# Patient Record
Sex: Female | Born: 1990 | Hispanic: Yes | Marital: Married | Smoking: Never smoker
Health system: Southern US, Community
[De-identification: ages and names within clinical notes are randomized; demographics above are authoritative.]

## PROBLEM LIST (undated history)

## (undated) ENCOUNTER — Inpatient Hospital Stay: Payer: Self-pay

## (undated) DIAGNOSIS — K8020 Calculus of gallbladder without cholecystitis without obstruction: Secondary | ICD-10-CM

## (undated) HISTORY — PX: NO PAST SURGERIES: SHX2092

## (undated) HISTORY — PX: CHOLECYSTECTOMY: SHX55

---
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
## 2012-02-14 ENCOUNTER — Emergency Department: Payer: Self-pay | Admitting: Emergency Medicine

## 2014-12-16 ENCOUNTER — Observation Stay: Payer: Self-pay | Admitting: Obstetrics and Gynecology

## 2015-03-10 ENCOUNTER — Other Ambulatory Visit: Payer: Self-pay | Admitting: Family Medicine

## 2015-03-10 DIAGNOSIS — Z3483 Encounter for supervision of other normal pregnancy, third trimester: Secondary | ICD-10-CM

## 2015-03-15 ENCOUNTER — Ambulatory Visit: Admission: RE | Admit: 2015-03-15 | Payer: Medicaid Other | Source: Ambulatory Visit

## 2015-03-24 ENCOUNTER — Ambulatory Visit
Admission: RE | Admit: 2015-03-24 | Discharge: 2015-03-24 | Disposition: A | Discharge status: Home / Self Care | Payer: Medicaid Other | Source: Ambulatory Visit | Attending: Family Medicine | Admitting: Family Medicine

## 2015-03-24 DIAGNOSIS — Z36 Encounter for antenatal screening of mother: Secondary | ICD-10-CM | POA: Diagnosis present

## 2015-03-24 DIAGNOSIS — Z3A35 35 weeks gestation of pregnancy: Secondary | ICD-10-CM | POA: Insufficient documentation

## 2015-03-24 DIAGNOSIS — O4103X0 Oligohydramnios, third trimester, not applicable or unspecified: Secondary | ICD-10-CM | POA: Diagnosis not present

## 2015-03-24 DIAGNOSIS — Z3483 Encounter for supervision of other normal pregnancy, third trimester: Secondary | ICD-10-CM

## 2015-06-27 ENCOUNTER — Encounter: Payer: Self-pay | Admitting: Obstetrics and Gynecology

## 2015-07-11 ENCOUNTER — Encounter: Payer: Self-pay | Admitting: Obstetrics and Gynecology

## 2016-01-15 ENCOUNTER — Observation Stay
Admission: EM | Admit: 2016-01-15 | Discharge: 2016-01-15 | Disposition: A | Discharge status: Home / Self Care | Payer: Medicaid Other | Attending: Obstetrics and Gynecology | Admitting: Obstetrics and Gynecology

## 2016-01-15 DIAGNOSIS — Z3A35 35 weeks gestation of pregnancy: Secondary | ICD-10-CM | POA: Insufficient documentation

## 2016-01-15 DIAGNOSIS — O9989 Other specified diseases and conditions complicating pregnancy, childbirth and the puerperium: Principal | ICD-10-CM | POA: Insufficient documentation

## 2016-01-15 DIAGNOSIS — O4290 Premature rupture of membranes, unspecified as to length of time between rupture and onset of labor, unspecified weeks of gestation: Secondary | ICD-10-CM | POA: Diagnosis present

## 2016-01-15 NOTE — OB Triage Note (Signed)
Pt. states she felt a "humongous gush" of fluid at approximately 0435 this morning. Denies VB or contractions. Endorses good fetal movement.

## 2016-01-15 NOTE — Plan of Care (Signed)
Report to MD/informed that pt is 25 yo G5 P2 35 weeks today.  She arrived with complaint of SROM. ES RN did exam and nitrazine negative, did not do exam as pt having no uc's. Reactive tracing at this time.  MD states to have pt discharged home with labor precautions and to keep next scheduled appt. Pt plans [facility] pt. Ellison Carwin Daekwon Beswick RNC

## 2016-01-15 NOTE — Discharge Instructions (Signed)
Keep next scheduled appt with [facility]. Pt to call this morning and get appointment as she states they did not tell her but she thinks it may be tomorrow.  [facility]. Pt agrees with plan of care and is ready to leave with husband ambulatory in stable condition for home. Discussed at length is question leaking again to come for evaluation [facility] or [facility]. Discussed labor precautions.  Ellison Carwin Tashya Alberty RNC

## 2016-01-15 NOTE — Final Progress Note (Signed)
L&D OB Triage Note  Dominique McgregorDora M Shelton is a 25 y.o. [id number] female at 6063w0d, EDD Estimated Date of Delivery: 02/19/16 who presented to triage for complaints of leaking fluid.  She was evaluated by the nurses with no significant findings of ruptured membranes, nitrazine test was negative, and no uterine contractions noted on monitor. Vital signs stable. An NST was performed and has been reviewed by MD.    Physical Exam:  Temperature 97.9 F (36.6 C), temperature source Oral, resp. rate 14, unknown if currently breastfeeding. Cervix: deferred NST INTERPRETATION: Indications: rule out uterine contractions  Mode: External Baseline Rate (A): 135 bpm Variability: Moderate Accelerations: 15 x 15 Decelerations: None     Contraction Frequency (min): none  Impression: reactive   Plan: NST performed was reviewed and was found to be reactive. She was discharged home with bleeding/labor precautions.  Continue routine prenatal care at [facility].  Pl[facility]. Follow up with OB/GYN as previously scheduled.     Hildred LaserAnika Merton Wadlow, MD [facility]

## 2016-05-28 ENCOUNTER — Emergency Department
Admission: EM | Admit: 2016-05-28 | Discharge: 2016-05-28 | Disposition: A | Discharge status: Home / Self Care | Payer: Medicaid Other | Attending: Emergency Medicine | Admitting: Emergency Medicine

## 2016-05-28 ENCOUNTER — Encounter: Payer: Self-pay | Admitting: Emergency Medicine

## 2016-05-28 ENCOUNTER — Emergency Department: Payer: Medicaid Other

## 2016-05-28 DIAGNOSIS — R1011 Right upper quadrant pain: Secondary | ICD-10-CM | POA: Diagnosis present

## 2016-05-28 DIAGNOSIS — K8020 Calculus of gallbladder without cholecystitis without obstruction: Secondary | ICD-10-CM

## 2016-05-28 LAB — CBC
HEMATOCRIT: 37.5 % (ref 35.0–47.0)
Hemoglobin: 13.1 g/dL (ref 12.0–16.0)
MCH: 29.4 pg (ref 26.0–34.0)
MCHC: 35 g/dL (ref 32.0–36.0)
MCV: 83.9 fL (ref 80.0–100.0)
PLATELETS: 222 10*3/uL (ref 150–440)
RBC: 4.47 MIL/uL (ref 3.80–5.20)
RDW: 15.1 % — AB (ref 11.5–14.5)
WBC: 11.1 10*3/uL — AB (ref 3.6–11.0)

## 2016-05-28 LAB — COMPREHENSIVE METABOLIC PANEL
ALK PHOS: 83 U/L (ref 38–126)
ALT: 44 U/L (ref 14–54)
AST: 60 U/L — AB (ref 15–41)
Albumin: 4.2 g/dL (ref 3.5–5.0)
Anion gap: 6 (ref 5–15)
BILIRUBIN TOTAL: 0.6 mg/dL (ref 0.3–1.2)
BUN: 16 mg/dL (ref 6–20)
CALCIUM: 8.8 mg/dL — AB (ref 8.9–10.3)
CO2: 26 mmol/L (ref 22–32)
Chloride: 108 mmol/L (ref 101–111)
Creatinine, Ser: 0.51 mg/dL (ref 0.44–1.00)
GFR calc Af Amer: >60 mL/min (ref >60)
Glucose, Bld: 108 mg/dL — ABNORMAL HIGH (ref 65–99)
POTASSIUM: 3.6 mmol/L (ref 3.5–5.1)
Sodium: 140 mmol/L (ref 135–145)
TOTAL PROTEIN: 7.6 g/dL (ref 6.5–8.1)

## 2016-05-28 LAB — LIPASE, BLOOD: Lipase: 30 U/L (ref 11–51)

## 2016-05-28 MED ORDER — ONDANSETRON HCL 4 MG/2ML IJ SOLN
4.0000 mg | Freq: Once | INTRAMUSCULAR | Status: AC
  Administered 2016-05-28: 4 mg via INTRAVENOUS
  Filled 2016-05-28: qty 2

## 2016-05-28 MED ORDER — OXYCODONE-ACETAMINOPHEN 5-325 MG PO TABS: 1.0000 | ORAL_TABLET | ORAL | 0 refills | Status: DC | PRN

## 2016-05-28 MED ORDER — ONDANSETRON 4 MG PO TBDP: 4.0000 mg | ORAL_TABLET | Freq: Three times a day (TID) | ORAL | 0 refills | Status: DC | PRN

## 2016-05-28 MED ORDER — MORPHINE SULFATE (PF) 2 MG/ML IV SOLN
2.0000 mg | Freq: Once | INTRAVENOUS | Status: AC
Start: 2016-05-28 — End: 2016-05-28
  Administered 2016-05-28: 2 mg via INTRAVENOUS
  Filled 2016-05-28: qty 1

## 2016-05-28 NOTE — Discharge Instructions (Addendum)
1. You may take pain and nausea medicines as needed (Percocet/Zofran #30). 2. Eat a bland diet until seen by the surgeon. Avoid heavy, spicy foods and alcohol. 3. Return to the ER for worsening symptoms, persistent vomiting, difficulty breathing or other concerns.

## 2016-05-28 NOTE — ED Notes (Signed)
Pt taken to US

## 2016-05-28 NOTE — ED Triage Notes (Signed)
Pt presents to with c/o abdominal pain x2 months and started having nausea/vomiting x2 weeks. Pt states she noticed blood in emesis once about a week ago. Pt denies urinary symptoms.

## 2016-05-28 NOTE — ED Provider Notes (Signed)
[facility] Emergency Department Provider Note   ____________________________________________   First MD Initiated Contact with Patient 05/28/16 [phone]     (approximate)  I have reviewed the triage vital signs and the nursing notes.   HISTORY  Chief Complaint Abdominal Pain    HPI Dominique Shelton is a 25 y.o. female who presents to the ED from home with a chief complain of abdominal pain. Patient is 3 months postpartum, not breast-feeding, who has experienced waxing/waning epigastric and right upper quadrant abdominal pain for the past 2 months. For the last 2 weeks she has been experiencing intermittent nausea and vomiting. This morning she presents for more severe pain after eating pork chops for dinner last night. Describes sharp, stabbing pain which is occasionally a burning sensation. Symptoms tonight were not associated with fever, chills, chest pain, shortness of breath, nausea, vomiting, diarrhea. Denies recent travel or trauma. Nothing makes her symptoms better. Food sometimes makes her symptoms worse.   Past medical history None  Patient Active Problem List   Diagnosis Date Noted  . Amniotic fluid leaking 01/15/2016    History reviewed. No pertinent surgical history.  Prior to Admission medications   Medication Sig Start Date End Date Taking? Authorizing Provider  Prenatal Vit-Fe Fumarate-FA (PRENATAL MULTIVITAMIN) TABS tablet Take 1 tablet by mouth daily at 12 noon.    Historical Provider, MD    Allergies Review of patient's allergies indicates no known allergies.  History reviewed. No pertinent family history.  Social History Social History  Substance Use Topics  . Smoking status: Never Smoker  . Smokeless tobacco: Never Used  . Alcohol use No    Review of Systems  Constitutional: No fever/chills. Eyes: No visual changes. ENT: No sore throat. Cardiovascular: Denies chest pain. Respiratory: Denies shortness of  breath. Gastrointestinal: Positive for abdominal pain.  No nausea, no vomiting.  No diarrhea.  No constipation. Genitourinary: Negative for dysuria. Musculoskeletal: Negative for back pain. Skin: Negative for rash. Neurological: Negative for headaches, focal weakness or numbness.  10-point ROS otherwise negative.  ____________________________________________   PHYSICAL EXAM:  VITAL SIGNS: ED Triage Vitals [05/28/16 0509]  Enc Vitals Group     BP 129/89     Pulse Rate 63     Resp 18     Temp 97.9 F (36.6 C)     Temp Source Oral     SpO2 98 %     Weight      Height  (1.549 m)     Head Circumference      Peak Flow      Pain Score 9     Pain Loc      Pain Edu?      Excl. in GC?     Constitutional: Alert and oriented. Well appearing and in no acute distress. Eyes: Conjunctivae are normal. PERRL. EOMI. Head: Atraumatic. Nose: No congestion/rhinnorhea. Mouth/Throat: Mucous membranes are moist.  Oropharynx non-erythematous. Neck: No stridor.   Cardiovascular: Normal rate, regular rhythm. Grossly normal heart sounds.  Good peripheral circulation. Respiratory: Normal respiratory effort.  No retractions. Lungs CTAB. Gastrointestinal: Soft and mildly tender to palpation epigastrium and right upper quadrant without rebound or guarding. No distention. No abdominal bruits. No CVA tenderness. Musculoskeletal: No lower extremity tenderness nor edema.  No joint effusions. Neurologic:  Normal speech and language. No gross focal neurologic deficits are appreciated. No gait instability. Skin:  Skin is warm, dry and intact. No rash noted. Psychiatric: Mood and affect are normal. Speech and  behavior are normal.  ____________________________________________   LABS (all labs ordered are listed, but only abnormal results are displayed)  Labs Reviewed  COMPREHENSIVE METABOLIC PANEL - Abnormal; Notable for the following:       Result Value   Glucose, Bld 108 (*)    Calcium 8.8 (*)     AST 60 (*)    All other components within normal limits  CBC - Abnormal; Notable for the following:    WBC 11.1 (*)    RDW 15.1 (*)    All other components within normal limits  LIPASE, BLOOD  URINALYSIS COMPLETEWITH MICROSCOPIC ([facility] ONLY)   ____________________________________________  EKG  None ____________________________________________  RADIOLOGY  Limited right upper quadrant abdominal ultrasound interpreted per Dr. Tyron RussellBoles: Cholelithiasis without evidence acute cholecystitis. ____________________________________________   PROCEDURES  Procedure(s) performed: None  Procedures  Critical Care performed: No  ____________________________________________   INITIAL IMPRESSION / ASSESSMENT AND PLAN / ED COURSE  Pertinent labs & imaging results that were available during my care of the patient were reviewed by me and considered in my medical decision making (see chart for details).  25 year old female who presents with epigastric and right upper quadrant abdominal pain. Initial laboratory results including LFTs and lipase are unremarkable. Will obtain right upper quadrant ultrasound to evaluate for cholecystitis.  Clinical Course  Comment By Time  Patient improved. Updated her of ultrasound results. Will refer to general surgery for outpatient follow-up. Strict return precautions given. Patient verbalizes understanding and agrees with plan of care. Irean HongJade J Sohrab Keelan, MD 08/08 [id number]     ____________________________________________   FINAL CLINICAL IMPRESSION(S) / ED DIAGNOSES  Final diagnoses:  Epigastric pain  Right upper quadrant pain  Calculus of gallbladder without cholecystitis without obstruction      NEW MEDICATIONS STARTED DURING THIS VISIT:  New Prescriptions   No medications on file     Note:  This document was prepared using Dragon voice recognition software and may include unintentional dictation errors.    Irean HongJade J Lean Fayson, MD 05/28/16 0730

## 2016-06-06 ENCOUNTER — Ambulatory Visit (INDEPENDENT_AMBULATORY_CARE_PROVIDER_SITE_OTHER): Payer: Medicaid Other | Admitting: Surgery

## 2016-06-06 ENCOUNTER — Encounter: Payer: Self-pay | Admitting: Surgery

## 2016-06-06 VITALS — BP 119/80 | HR 66 | Temp 98.5°F | Ht 61.0 in | Wt 176.0 lb

## 2016-06-06 DIAGNOSIS — K8050 Calculus of bile duct without cholangitis or cholecystitis without obstruction: Secondary | ICD-10-CM

## 2016-06-06 DIAGNOSIS — K279 Peptic ulcer, site unspecified, unspecified as acute or chronic, without hemorrhage or perforation: Secondary | ICD-10-CM

## 2016-06-06 DIAGNOSIS — K921 Melena: Secondary | ICD-10-CM

## 2016-06-06 MED ORDER — FAMOTIDINE 20 MG PO TABS: 20.0000 mg | ORAL_TABLET | Freq: Every day | ORAL | 1 refills | Status: DC

## 2016-06-06 MED ORDER — SUCRALFATE 1 G PO TABS: 1.0000 g | ORAL_TABLET | Freq: Three times a day (TID) | ORAL | 1 refills | Status: DC

## 2016-06-06 NOTE — Patient Instructions (Signed)
We will see you next week to follow up on how are feeling with the prescribed medications.   We will also schedule you an appointment to see Dr. Midge Miniumarren Wohl. He is a SolicitorGastroenterologist. We will call you with that appointment as soon as it's scheduled.

## 2016-06-06 NOTE — Progress Notes (Signed)
Dominique McgregorDora M Shelton is an [age] y.o. female.   Chief Complaint: Epigastric pain HPI: This a patient who is in the emergency room for epigastric and right upper quadrant pain her workup suggested gallstones without cholecystitis. I was asked see the patient for gallstones and probable biliary colic. On further questioning of the patient she describes 2 months of epigastric pain right upper quadrant pain left upper quadrant pain and back pain is not associated with fatty foods and sometimes happens on an empty stomach. She's had no fevers she's had nausea and vomiting and his attacks are fairly frequent. Of great significance is that she has vomited blood on 3 different occasions and has had multiple stools which are melanotic. she is not dizzy. She has not taken any medications to improve or attempt to change the course of this other than what sounds like an anti-E Medick and oxycodone  No past medical history on file.  Past Surgical History:  Procedure Laterality Date  . NO PAST SURGERIES      Family History  Problem Relation Age of Onset  . Heart disease Mother   . Hypertension Mother   . Heart disease Father   . Hypertension Father    Social History:  reports that she has never smoked. She has never used smokeless tobacco. She reports that she drinks alcohol. She reports that she does not use drugs.  Allergies: No Known Allergies   (Not in a hospital admission)   Review of Systems:   Review of Systems  Constitutional: Negative for chills and fever.  HENT: Negative.   Eyes: Negative.   Respiratory: Negative.   Cardiovascular: Negative.   Gastrointestinal: Positive for abdominal pain, blood in stool, heartburn, melena, nausea and vomiting. Negative for constipation and diarrhea.  Genitourinary: Negative.   Musculoskeletal: Negative.   Skin: Negative.   Neurological: Positive for weakness.  Endo/Heme/Allergies: Negative.   Psychiatric/Behavioral: Negative.     Physical  Exam:  Physical Exam  Constitutional: She is oriented to person, place, and time and well-developed, well-nourished, and in no distress. No distress.  HENT:  Head: Normocephalic and atraumatic.  Eyes: Right eye exhibits no discharge. Left eye exhibits no discharge. No scleral icterus.  Neck: Normal range of motion.  Cardiovascular: Normal rate, regular rhythm and normal heart sounds.   No murmur heard. Pulmonary/Chest: Effort normal and breath sounds normal. No respiratory distress. She has no wheezes. She has no rales.  Abdominal: Soft. She exhibits no distension. There is no tenderness. There is no rebound and no guarding.  Negative Murphy sign Minimal epigastric tenderness, no right upper quadrant tenderness no CVA tenderness  Musculoskeletal: Normal range of motion. She exhibits no edema or tenderness.  Lymphadenopathy:    She has no cervical adenopathy.  Neurological: She is alert and oriented to person, place, and time.  Skin: Skin is warm and dry. No rash noted. She is not diaphoretic. No erythema.  Multiple tattoos  Psychiatric: Mood and affect normal.  Vitals reviewed.   Blood pressure 119/80, pulse 66, temperature 98.5 F (36.9 C), temperature source Oral, height 5\' 1"  (1.549 m), weight 176 lb (79.8 kg), currently breastfeeding.    No results found for this or any previous visit (from the past 48 hour(s)). No results found.   Assessment/Plan Labs and studies are reviewed personally. Suggestive of possible biliary colic. However her symptoms are suggestive more of peptic ulcer disease in that she has not tried any antacids to relieve her symptoms but she's had mostly epigastric pain  with some hematemesis and melena. With that in mind I would like to try Carafate and a proton pump inhibitor or H2 blocker and see her back prior to scheduling gallbladder surgery.  Lattie Hawichard E Eulia Hatcher, MD, FACS

## 2016-06-13 ENCOUNTER — Ambulatory Visit: Payer: Self-pay | Admitting: General Surgery

## 2016-06-17 ENCOUNTER — Ambulatory Visit: Payer: Self-pay | Admitting: Surgery

## 2016-06-30 ENCOUNTER — Emergency Department
Admission: EM | Admit: 2016-06-30 | Discharge: 2016-06-30 | Discharge status: Against Medical Advice | Payer: Medicaid Other | Attending: Emergency Medicine | Admitting: Emergency Medicine

## 2016-06-30 ENCOUNTER — Encounter: Payer: Self-pay | Admitting: Emergency Medicine

## 2016-06-30 DIAGNOSIS — R1013 Epigastric pain: Secondary | ICD-10-CM | POA: Diagnosis present

## 2016-06-30 HISTORY — DX: Calculus of gallbladder without cholecystitis without obstruction: K80.20

## 2016-06-30 MED ORDER — SODIUM CHLORIDE 0.9 % IV BOLUS (SEPSIS): 1000.0000 mL | Freq: Once | INTRAVENOUS | Status: DC

## 2016-06-30 MED ORDER — MORPHINE SULFATE (PF) 4 MG/ML IV SOLN: 4.0000 mg | Freq: Once | INTRAVENOUS | Status: DC

## 2016-06-30 MED ORDER — ONDANSETRON HCL 4 MG/2ML IJ SOLN: 4.0000 mg | Freq: Once | INTRAMUSCULAR | Status: DC

## 2016-06-30 NOTE — ED Notes (Signed)
Spoke with MD - Dr. Manson PasseyBrown does NOT wish for protocol orders to be carried at this time; no imaging or labs. Will pass along to on-coming RN.

## 2016-06-30 NOTE — ED Triage Notes (Addendum)
Pt seen here about 4 weeks ago and diagnosed with gallstones; followed up with surgeon and placed on medication; pt was told if pain did not improve she would need to be scheduled for cholecystectomy; pt says she began having RUQ pain last evening; was able to rest some; woke around 0130 with pain that has been increasing since; N/, no vomiting; pt says she still has a few percocet and zofran left that she was prescribed but did not take any because "the pain gets worse in the afternoon" and she wanted to save them for the afternoon

## 2016-06-30 NOTE — ED Notes (Signed)
Notified by EDT that patient wanting to leave due to having somewhere to be.  Informed tech that I was tied up with another patient and asked him to notify MD.  MD was notified by EDT who also called me to inform me that the patient wanted to leave.  [facility].

## 2016-06-30 NOTE — ED Notes (Signed)
Patient requesting to leave A[facility] to [facility]. Patient understands risk of leaving AMA.

## 2016-06-30 NOTE — ED Provider Notes (Signed)
[facility] Emergency Department Provider Note    ____________________________________________   I have reviewed the triage vital signs and the nursing notes.   HISTORY  Chief Complaint Abdominal Pain   History limited by: Not Limited   HPI Dominique Shelton is a 25 y.o. female with history of gallstones who presents to the emergency department today because of concerns for epigastric pain. Patient has been having the pain on and off for weeks. Was seen previously in the emergency department with right upper quadrant ultrasound showing gallstones without any signs of cholecystitis. She did have one follow-up appointment with surgery. She was given pain medications. Starting last night pain became more severe. It was located in the epigastric region. The patient did not try any of her pain medication. She did have some accompanying vomiting. Denied any blood in the vomit. She denies any recent fevers.   Past Medical History:  Diagnosis Date  . Gallstones     Patient Active Problem List   Diagnosis Date Noted  . 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 NO PAST SURGERIES      Prior to Admission medications   Medication Sig Start Date End Date Taking? Authorizing Provider  famotidine (PEPCID) 20 MG tablet Take 1 tablet (20 mg total) by mouth daily. 06/06/16  Yes Lattie Haw, MD  ondansetron (ZOFRAN ODT) 4 MG disintegrating tablet Take 1 tablet (4 mg total) by mouth every 8 (eight) hours as needed for nausea or vomiting. 05/28/16  Yes Irean Hong, MD  oxyCODONE-acetaminophen (ROXICET) 5-325 MG tablet Take 1 tablet by mouth every 4 (four) hours as needed for severe pain. 05/28/16  Yes Irean Hong, MD  Prenatal Vit-Fe Fumarate-FA (PRENATAL MULTIVITAMIN) TABS tablet Take 1 tablet by mouth daily at 12 noon.   Yes Historical Provider, MD  sucralfate (CARAFATE) 1 g tablet Take 1 tablet (1 g total) by mouth 4 (four)  times daily -  with meals and at bedtime. 06/06/16  Yes Lattie Haw, MD    Allergies Review of patient's allergies indicates no known allergies.  Family History  Problem Relation Age of Onset  . Heart disease Mother   . Hypertension Mother   . Heart disease Father   . Hypertension Father     Social History Social History  Substance Use Topics  . Smoking status: Never Smoker  . Smokeless tobacco: Never Used  . Alcohol use Yes     Comment: occas    Review of Systems  Constitutional: Negative for fever. Cardiovascular: Negative for chest pain. Respiratory: Negative for shortness of breath. Gastrointestinal: Positive for abdominal pain and vomiting. Genitourinary: Negative for dysuria. Musculoskeletal: Negative for back pain. Skin: Negative for rash. Neurological: Negative for headaches, focal weakness or numbness.   10-point ROS otherwise negative.  ____________________________________________   PHYSICAL EXAM:  VITAL SIGNS: ED Triage Vitals   Enc Vitals Group     BP 134/82     Pulse Rate 60     Resp 18     Temp 97.6 F (36.4 C)     Temp Source Oral     SpO2 99 %     Weight 176 lb (79.8 kg)     Height 5\' 1"  (1.549 m)     Head Circumference      Peak Flow      Pain Score 9   Constitutional: Alert and oriented. Appears uncomfortable.  Eyes: Conjunctivae are normal. Normal  extraocular movements. ENT   Head: Normocephalic and atraumatic.   Nose: No congestion/rhinnorhea.   Mouth/Throat: Mucous membranes are moist.   Neck: No stridor. Hematological/Lymphatic/Immunilogical: No cervical lymphadenopathy. Cardiovascular: Normal rate, regular rhythm.  No murmurs, rubs, or gallops. Respiratory: Normal respiratory effort without tachypnea nor retractions. Breath sounds are clear and equal bilaterally. No wheezes/rales/rhonchi. Gastrointestinal: Soft and tender to palpation in the epigastric and right upper quadrants. No rebound. No guarding.   Genitourinary: Deferred Musculoskeletal: Normal range of motion in all extremities. No lower extremity edema. Neurologic:  Normal speech and language. No gross focal neurologic deficits are appreciated.  Skin:  Skin is warm, dry and intact. No rash noted. Psychiatric: Mood and affect are normal. Speech and behavior are normal. Patient exhibits appropriate insight and judgment.  ____________________________________________    LABS (pertinent positives/negatives)  Patient left prior to lab work  ____________________________________________   EKG  None  ____________________________________________    RADIOLOGY  None  ____________________________________________   PROCEDURES  Procedures  ____________________________________________   INITIAL IMPRESSION / ASSESSMENT AND PLAN / ED COURSE  Pertinent labs & imaging results that were available during my care of the patient were reviewed by me and considered in my medical decision making (see chart for details).  Patient presented to the emergency department today because of concern for abdominal pain, worse since last night. History of gallstones, has seen surgery as outpatient. Did not take any home pain medication. Has had vomiting. Concern for cholecystitis/biliary colic/gastritis/pancreatitis. Will check blood work, give pain medication and fluids. At this point will hold on US of RUQ unless blood work with leukocytosis, elevated liver tests or lipase.  Clinical Course   Called to patient's room. They stated they would like to be discharged. When asked why they stated they wanted to [facility] because they did not think blood work and pain medication and then discharge would be sufficient. I explained to the patient and companion that the plan would be to check blood work, give medication and then reassess to see if further testing/work up would be necessary. They still requested to be  discharged. ____________________________________________   FINAL CLINICAL IMPRESSION(S) / ED DIAGNOSES  Final diagnoses:  Epigastric pain     Note: This dictation was prepared with Dragon dictation. Any transcriptional errors that result from this process are unintentional    Phineas SemenGraydon Sony Schlarb, MD 06/30/16 [phone]

## 2016-07-01 ENCOUNTER — Ambulatory Visit: Payer: Self-pay | Admitting: Surgery

## 2016-07-11 ENCOUNTER — Ambulatory Visit (INDEPENDENT_AMBULATORY_CARE_PROVIDER_SITE_OTHER): Payer: Self-pay | Admitting: Gastroenterology

## 2016-07-11 VITALS — BP 125/68 | HR 76 | Temp 98.4°F | Ht 61.0 in | Wt 177.0 lb

## 2016-07-11 DIAGNOSIS — R110 Nausea: Secondary | ICD-10-CM

## 2016-07-11 DIAGNOSIS — K920 Hematemesis: Secondary | ICD-10-CM

## 2016-07-11 DIAGNOSIS — K921 Melena: Secondary | ICD-10-CM

## 2016-07-11 NOTE — Progress Notes (Signed)
Gastroenterology Consultation  Referring Provider:     Lattie Hawooper, Richard E, MD Primary Care Physician:  No PCP Per Patient Primary Gastroenterologist:  Dr. Servando SnareWohl     Reason for Consultation:     Hematemesis and rectal bleeding        HPI:   Dominique Shelton is a 25 y.o. y/o female referred for consultation & management of Hematemesis or rectal bleeding by Dr. Bonnetta BarryNo PCP Per Patient.  Patient was sent to me for evaluation after having some nausea with vomiting and bloody stools.   Patient states the vomiting blood occurred after she would vomit multiple times and then blood would come up.  The patient states that since she had her gallbladder taken out she has been feeling much better and only has pain in her abdomen with scars are from her surgery.   She states that over the last 3 weeks she has had no further igns of bleeding or vomiting. The patient reports that her symptoms started shortly after she had given birth to her son 5 months ago.  She states that it started approximately 2 months after that  Past Medical History:  Diagnosis Date  . Gallstones     Past Surgical History:  Procedure Laterality Date  . NO PAST SURGERIES      Prior to Admission medications   Medication Sig Start Date End Date Taking? Authorizing Provider  famotidine (PEPCID) 20 MG tablet Take 1 tablet (20 mg total) by mouth daily. 06/06/16  Yes Lattie Hawichard E Cooper, MD  ondansetron (ZOFRAN ODT) 4 MG disintegrating tablet Take 1 tablet (4 mg total) by mouth every 8 (eight) hours as needed for nausea or vomiting. 05/28/16  Yes Irean HongJade J Sung, MD  Prenatal Vit-Fe Fumarate-FA (PRENATAL MULTIVITAMIN) TABS tablet Take 1 tablet by mouth daily at 12 noon.   Yes Historical Provider, MD  sucralfate (CARAFATE) 1 g tablet Take 1 tablet (1 g total) by mouth 4 (four) times daily -  with meals and at bedtime. 06/06/16  Yes Lattie Hawichard E Cooper, MD  oxyCODONE-acetaminophen (ROXICET) 5-325 MG tablet Take 1 tablet by mouth every 4 (four) hours as needed  for severe pain. Patient not taking: Reported on 07/11/2016 05/28/16   Irean HongJade J Sung, MD    Family History  Problem Relation Age of Onset  . Heart disease Mother   . Hypertension Mother   . Heart disease Father   . Hypertension Father      Social History  Substance Use Topics  . Smoking status: Never Smoker  . Smokeless tobacco: Never Used  . Alcohol use Yes     Comment: occas    Allergies as of 07/11/2016  . (No Known Allergies)    Review of Systems:    All systems reviewed and negative except where noted in HPI.   Physical Exam:  BP 125/68   Pulse 76   Temp 98.4 F (36.9 C) (Oral)   Ht 5\' 1"  (1.549 m)   Wt 177 lb (80.3 kg)   LMP  (LMP Unknown)   BMI 33.44 kg/m  No LMP recorded (lmp unknown). Patient has had an implant. Psych:  Alert and cooperative. Normal mood and affect. General:   Alert,  Well-developed, well-nourished, pleasant and cooperative in NAD Head:  Normocephalic and atraumatic. Eyes:  Sclera clear, no icterus.   Conjunctiva pink. Ears:  Normal auditory acuity. Nose:  No deformity, discharge, or lesions. Mouth:  No deformity or lesions,oropharynx pink & moist. Neck:  Supple; no masses  or thyromegaly. Lungs:  Respirations even and unlabored.  Clear throughout to auscultation.   No wheezes, crackles, or rhonchi. No acute distress. Heart:  Regular rate and rhythm; no murmurs, clicks, rubs, or gallops. Abdomen:  Normal bowel sounds.  No bruits.  Soft, non-tender and non-distended without masses, hepatosplenomegaly or hernias noted.  No guarding or rebound tenderness.  Negative Carnett sign.   Rectal:  Deferred.  Msk:  Symmetrical without gross deformities.  Good, equal movement & strength bilaterally. Pulses:  Normal pulses noted. Extremities:  No clubbing or edema.  No cyanosis. Neurologic:  Alert and oriented x3;  grossly normal neurologically. Skin:  Intact without significant lesions or rashes.  No jaundice. Lymph Nodes:  No significant cervical  adenopathy. Psych:  Alert and cooperative. Normal mood and affect.  Imaging Studies: No results found.  Assessment and Plan:   Dominique Shelton is a 25 y.o. y/o female Who reports that she had nausea vomiting and rectal bleeding with diarrhea prior to having her gallbladder out.  The patient states that she has been doing well since without any further problems.  The patient also reports that her abdominal pain has resolved.  The patient has been told to contact me if any of her symptoms return.  As for now the patient will be followed as needed.   Note: This dictation was prepared with Dragon dictation along with smaller phrase technology. Any transcriptional errors that result from this process are unintentional.

## 2016-09-13 ENCOUNTER — Encounter: Payer: Self-pay | Admitting: Emergency Medicine

## 2016-09-13 ENCOUNTER — Emergency Department
Admission: EM | Admit: 2016-09-13 | Discharge: 2016-09-14 | Disposition: A | Discharge status: Home / Self Care | Payer: Medicaid Other | Attending: Emergency Medicine | Admitting: Emergency Medicine

## 2016-09-13 DIAGNOSIS — R109 Unspecified abdominal pain: Secondary | ICD-10-CM | POA: Diagnosis present

## 2016-09-13 DIAGNOSIS — Z79899 Other long term (current) drug therapy: Secondary | ICD-10-CM | POA: Diagnosis not present

## 2016-09-13 DIAGNOSIS — R1084 Generalized abdominal pain: Secondary | ICD-10-CM

## 2016-09-13 DIAGNOSIS — N390 Urinary tract infection, site not specified: Secondary | ICD-10-CM | POA: Diagnosis not present

## 2016-09-13 DIAGNOSIS — R112 Nausea with vomiting, unspecified: Secondary | ICD-10-CM

## 2016-09-13 NOTE — ED Triage Notes (Signed)
Per EMS patient called out for severe n/v/d with projectile vomit the last couple of hours.  Pt states it was brown in color.  She appears out of it but answers questions if asked.

## 2016-09-14 ENCOUNTER — Emergency Department: Payer: Medicaid Other

## 2016-09-14 LAB — URINALYSIS COMPLETE WITH MICROSCOPIC (ARMC ONLY)
Bilirubin Urine: NEGATIVE
GLUCOSE, UA: NEGATIVE mg/dL
NITRITE: NEGATIVE
Protein, ur: 100 mg/dL — AB
Specific Gravity, Urine: 1.034 — ABNORMAL HIGH (ref 1.005–1.030)
pH: 5 (ref 5.0–8.0)

## 2016-09-14 LAB — CBC
HEMATOCRIT: 46.4 % (ref 35.0–47.0)
Hemoglobin: 15.5 g/dL (ref 12.0–16.0)
MCH: 28.9 pg (ref 26.0–34.0)
MCHC: 33.4 g/dL (ref 32.0–36.0)
MCV: 86.4 fL (ref 80.0–100.0)
Platelets: 299 10*3/uL (ref 150–440)
RBC: 5.37 MIL/uL — ABNORMAL HIGH (ref 3.80–5.20)
RDW: 14.5 % (ref 11.5–14.5)
WBC: 23.4 10*3/uL — ABNORMAL HIGH (ref 3.6–11.0)

## 2016-09-14 LAB — COMPREHENSIVE METABOLIC PANEL
ALBUMIN: 5.1 g/dL — AB (ref 3.5–5.0)
ALT: 30 U/L (ref 14–54)
AST: 29 U/L (ref 15–41)
Alkaline Phosphatase: 90 U/L (ref 38–126)
Anion gap: 10 (ref 5–15)
BILIRUBIN TOTAL: 0.4 mg/dL (ref 0.3–1.2)
BUN: 20 mg/dL (ref 6–20)
CO2: 21 mmol/L — ABNORMAL LOW (ref 22–32)
Calcium: 9.5 mg/dL (ref 8.9–10.3)
Chloride: 108 mmol/L (ref 101–111)
Creatinine, Ser: 0.76 mg/dL (ref 0.44–1.00)
GFR calc Af Amer: >60 mL/min (ref >60)
GFR calc non Af Amer: >60 mL/min (ref >60)
GLUCOSE: 130 mg/dL — AB (ref 65–99)
POTASSIUM: 3.7 mmol/L (ref 3.5–5.1)
Sodium: 139 mmol/L (ref 135–145)
TOTAL PROTEIN: 9.4 g/dL — AB (ref 6.5–8.1)

## 2016-09-14 LAB — LIPASE, BLOOD: Lipase: 20 U/L (ref 11–51)

## 2016-09-14 LAB — HCG, QUANTITATIVE, PREGNANCY

## 2016-09-14 MED ORDER — DEXTROSE 5 % IV SOLN: 1.0000 g | Freq: Once | INTRAVENOUS | Status: DC

## 2016-09-14 MED ORDER — IOPAMIDOL (ISOVUE-300) INJECTION 61%
100.0000 mL | Freq: Once | INTRAVENOUS | Status: AC | PRN
  Administered 2016-09-14: 100 mL via INTRAVENOUS

## 2016-09-14 MED ORDER — ONDANSETRON HCL 4 MG/2ML IJ SOLN
4.0000 mg | Freq: Once | INTRAMUSCULAR | Status: AC
  Administered 2016-09-14: 4 mg via INTRAVENOUS
  Filled 2016-09-14: qty 2

## 2016-09-14 MED ORDER — CEPHALEXIN 500 MG PO CAPS: 500.0000 mg | ORAL_CAPSULE | Freq: Three times a day (TID) | ORAL | 0 refills | Status: DC

## 2016-09-14 MED ORDER — CEFTRIAXONE SODIUM-DEXTROSE 1-3.74 GM-% IV SOLR
1.0000 g | Freq: Once | INTRAVENOUS | Status: AC
  Administered 2016-09-14: 1 g via INTRAVENOUS
  Filled 2016-09-14: qty 50

## 2016-09-14 MED ORDER — MORPHINE SULFATE (PF) 4 MG/ML IV SOLN
4.0000 mg | Freq: Once | INTRAVENOUS | Status: AC
  Administered 2016-09-14: 4 mg via INTRAVENOUS
  Filled 2016-09-14: qty 1

## 2016-09-14 MED ORDER — ONDANSETRON 4 MG PO TBDP: 4.0000 mg | ORAL_TABLET | Freq: Three times a day (TID) | ORAL | 0 refills | Status: DC | PRN

## 2016-09-14 MED ORDER — IOPAMIDOL (ISOVUE-300) INJECTION 61%
30.0000 mL | Freq: Once | INTRAVENOUS | Status: AC | PRN
  Administered 2016-09-14: 30 mL via ORAL

## 2016-09-14 NOTE — ED Provider Notes (Signed)
[facility] Emergency Department Provider Note   ____________________________________________   First MD Initiated Contact with Patient 09/14/16 [phone]     (approximate)  I have reviewed the triage vital signs and the nursing notes.   HISTORY  Chief Complaint Abdominal Pain    HPI Dominique Shelton is a 25 y.o. female who presents to the ED from home via EMS with a chief complaint of abdominal pain.Patient has a history of recurrent abdominal pain, nausea/vomiting and rectal bleeding with diarrhea following birth of child in the spring. She had a laparoscopic cholecystectomy in September which greatly alleviated most of her symptoms. She has been followed as needed by GI for hematemesis and rectal bleeding. Complains of generalized abdominal pain, nausea, vomiting and diarrhea since approximately 2 PM. Reports equal number of vomiting and diarrhea. Denies associated fever, chills, chest pain, shortness of breath, dysuria. Denies recent travel or trauma. Nothing makes her symptoms better or worse.   Past Medical History:  Diagnosis Date  . Gallstones     Patient Active Problem List   Diagnosis Date Noted  . Amniotic fluid leaking 01/15/2016    Past surgical history Laparoscopic cholecystectomy  Prior to Admission medications   Medication Sig Start Date End Date Taking? Authorizing Provider  cephALEXin (KEFLEX) 500 MG capsule Take 1 capsule (500 mg total) by mouth 3 (three) times daily. 09/14/16   Irean Hong, MD  famotidine (PEPCID) 20 MG tablet Take 1 tablet (20 mg total) by mouth daily. 06/06/16   Lattie Haw, MD  ondansetron (ZOFRAN ODT) 4 MG disintegrating tablet Take 1 tablet (4 mg total) by mouth every 8 (eight) hours as needed for nausea or vomiting. 09/14/16   Irean Hong, MD  oxyCODONE-acetaminophen (ROXICET) 5-325 MG tablet Take 1 tablet by mouth every 4 (four) hours as needed for severe pain. Patient not taking: Reported on 07/11/2016 05/28/16    Irean Hong, MD  Prenatal Vit-Fe Fumarate-FA (PRENATAL MULTIVITAMIN) TABS tablet Take 1 tablet by mouth daily at 12 noon.    Historical Provider, MD  sucralfate (CARAFATE) 1 g tablet Take 1 tablet (1 g total) by mouth 4 (four) times daily -  with meals and at bedtime. 06/06/16   Lattie Haw, MD    Allergies Patient has no known allergies.  Family History  Problem Relation Age of Onset  . Heart disease Mother   . Hypertension Mother   . Heart disease Father   . Hypertension Father     Social History Social History  Substance Use Topics  . Smoking status: Never Smoker  . Smokeless tobacco: Never Used  . Alcohol use Yes     Comment: occas    Review of Systems  Constitutional: No fever/chills. Eyes: No visual changes. ENT: No sore throat. Cardiovascular: Denies chest pain. Respiratory: Denies shortness of breath. Gastrointestinal: Positive for abdominal pain, nausea, vomiting and diarrhea.  No constipation. Genitourinary: Negative for dysuria. Musculoskeletal: Negative for back pain. Skin: Negative for rash. Neurological: Negative for headaches, focal weakness or numbness.  10-point ROS otherwise negative.  ____________________________________________   PHYSICAL EXAM:  VITAL SIGNS: ED Triage Vitals [09/13/16 2351]  Enc Vitals Group     BP 95/68     Pulse Rate (!) 105     Resp 15     Temp      Temp src      SpO2 96 %     Weight      Height  Head Circumference      Peak Flow      Pain Score      Pain Loc      Pain Edu?      Excl. in GC?     Constitutional: Alert and oriented. Well appearing and in mild acute distress. Eyes: Conjunctivae are normal. PERRL. EOMI. Head: Atraumatic. Nose: No congestion/rhinnorhea. Mouth/Throat: Mucous membranes are moist.  Oropharynx non-erythematous. Neck: No stridor.   Cardiovascular: Normal rate, regular rhythm. Grossly normal heart sounds.  Good peripheral circulation. Respiratory: Normal respiratory effort.   No retractions. Lungs CTAB. Gastrointestinal: Soft and mild diffuse tenderness to palpation without rebound or guarding. No distention. No abdominal bruits. No CVA tenderness. Musculoskeletal: No lower extremity tenderness nor edema.  No joint effusions. Neurologic:  Normal speech and language. No gross focal neurologic deficits are appreciated. No gait instability. Skin:  Skin is warm, dry and intact. No rash noted. Psychiatric: Mood and affect are normal. Speech and behavior are normal.  ____________________________________________   LABS (all labs ordered are listed, but only abnormal results are displayed)  Labs Reviewed  COMPREHENSIVE METABOLIC PANEL - Abnormal; Notable for the following:       Result Value   CO2 21 (*)    Glucose, Bld 130 (*)    Total Protein 9.4 (*)    Albumin 5.1 (*)    All other components within normal limits  CBC - Abnormal; Notable for the following:    WBC 23.4 (*)    RBC 5.37 (*)    All other components within normal limits  URINALYSIS COMPLETEWITH MICROSCOPIC ([facility] ONLY) - Abnormal; Notable for the following:    Color, Urine AMBER (*)    APPearance CLOUDY (*)    Ketones, ur TRACE (*)    Specific Gravity, Urine 1.034 (*)    Hgb urine dipstick 3+ (*)    Protein, ur 100 (*)    Leukocytes, UA 1+ (*)    Bacteria, UA FEW (*)    Squamous Epithelial / LPF 6-30 (*)    All other components within normal limits  LIPASE, BLOOD  HCG, QUANTITATIVE, PREGNANCY   ____________________________________________  EKG  None ____________________________________________  RADIOLOGY  CT abdomen and pelvis with contrast interpreted per Dr. Harrie JeansStratton: No acute process identified. Status post cholecystectomy. ____________________________________________   PROCEDURES  Procedure(s) performed: None  Procedures  Critical Care performed: No  ____________________________________________   INITIAL IMPRESSION / ASSESSMENT AND PLAN / ED COURSE  Pertinent  labs & imaging results that were available during my care of the patient were reviewed by me and considered in my medical decision making (see chart for details).  25 year old female who presents with generalized abdominal pain, N/V/D. Laboratory results reveal leukocytosis; electrolytes within normal limits. Will continue IV fluid resuscitation, analgesia, antiemetic and proceed to CT scanning of abdomen/pelvis to evaluate for intra-abdominal etiology.  Clinical Course as of Sep 15 615  Sat Sep 14, 2016  [id number] Delay secondary to another critical patient. Patient is feeling much better. Tolerated PO without emesis. Will place on Keflex for UTI, Zofran as needed for nauseous as vomiting and patient will follow-up with her PCP next week. Strict return precautions given. Patient verbalizes understanding and agrees with plan of care.  [JS]    Clinical Course User Index [JS] Irean HongJade J Sung, MD     ____________________________________________   FINAL CLINICAL IMPRESSION(S) / ED DIAGNOSES  Final diagnoses:  Lower urinary tract infectious disease  Generalized abdominal pain  Non-intractable vomiting with nausea, unspecified vomiting type  NEW MEDICATIONS STARTED DURING THIS VISIT:  New Prescriptions   CEPHALEXIN (KEFLEX) 500 MG CAPSULE    Take 1 capsule (500 mg total) by mouth 3 (three) times daily.   ONDANSETRON (ZOFRAN ODT) 4 MG DISINTEGRATING TABLET    Take 1 tablet (4 mg total) by mouth every 8 (eight) hours as needed for nausea or vomiting.     Note:  This document was prepared using Dragon voice recognition software and may include unintentional dictation errors.    Irean HongJade J Sung, MD 09/14/16 [phone]

## 2016-09-14 NOTE — ED Notes (Signed)
Pt. only has 100 mL oral contrast left and Dolores FrameSung says its ok to scan her with that amount left, Pam at CT notified.

## 2016-09-14 NOTE — ED Notes (Signed)
Patient's sister called for their transportation.

## 2016-09-14 NOTE — ED Notes (Signed)
Patient transported to CT 

## 2016-09-14 NOTE — Discharge Instructions (Signed)
1. Take antibiotic as prescribed (Keflex 500 mg 3 times daily 7 days). 2. You may take Zofran as needed for nausea. 3. Clear liquids 12 hours, then bland diet 3 days, then slowly advance diet as tolerated. 4. Return to the ER for worsening symptoms, persistent vomiting, difficulty breathing or other concerns.

## 2017-06-22 IMAGING — CT CT ABD-PELV W/ CM
2 of 4 series · 16 of 46 positions shown, 18 images · IV contrast (APPLIED)
Comparison: 05/28/2016 abdominal sonogram.

CLINICAL DATA: 25 y/o F; upper mid abdominal pain and leukocytosis.

EXAM:
CT ABDOMEN AND PELVIS WITH CONTRAST
TECHNIQUE: Multidetector CT imaging of the abdomen and pelvis was performed
using the standard protocol following bolus administration of
intravenous contrast.
CONTRAST:  100mL [id number] IOPAMIDOL ([id number]) INJECTION 61%

[Series 2: axial st · axial · 0.84mm/px · z∈[-1109,-669]mm · 13 of 98 slices shown, 15 images]
[im 5/98  soft-tissue]
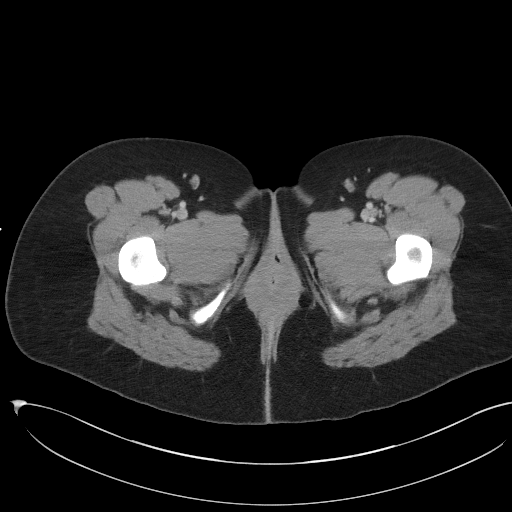
[im 5/98  bone]
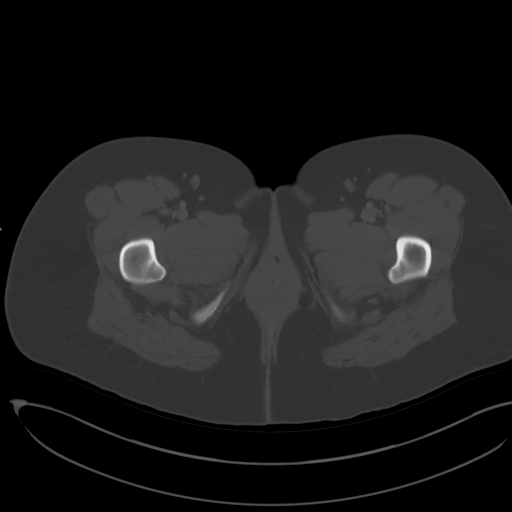
[im 13/98  soft-tissue]
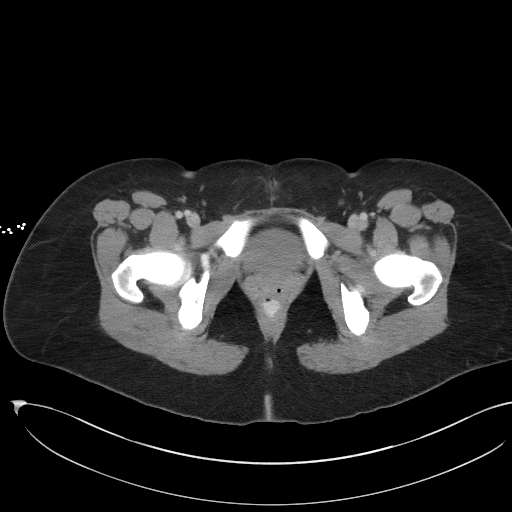
[im 21/98  soft-tissue]
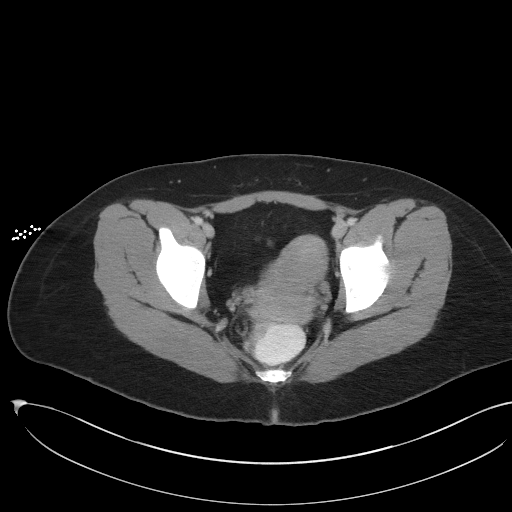
[im 29/98  soft-tissue]
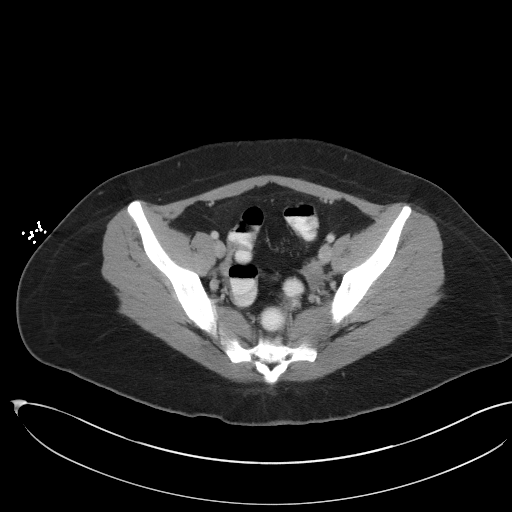
[im 33/98  soft-tissue]
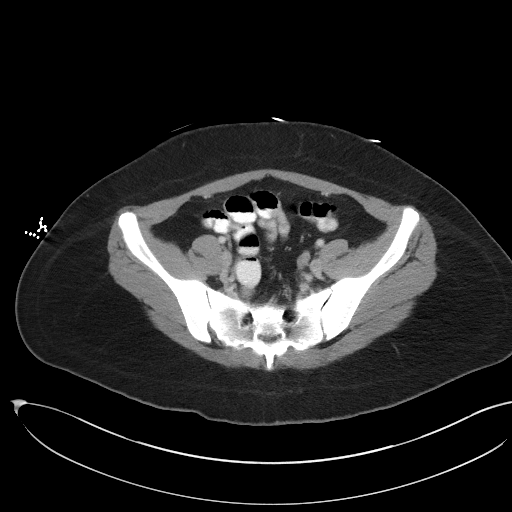
[im 41/98  soft-tissue]
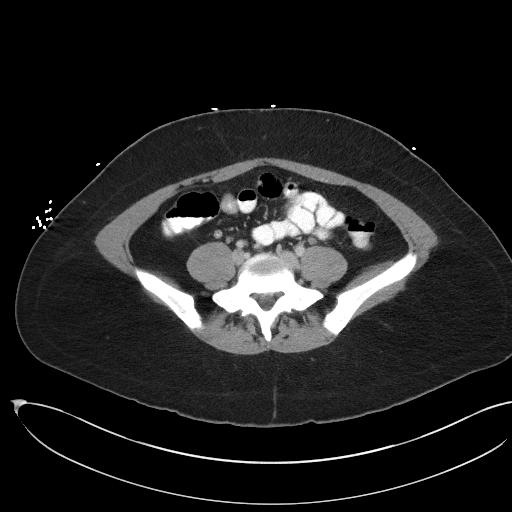
[im 49/98  soft-tissue]
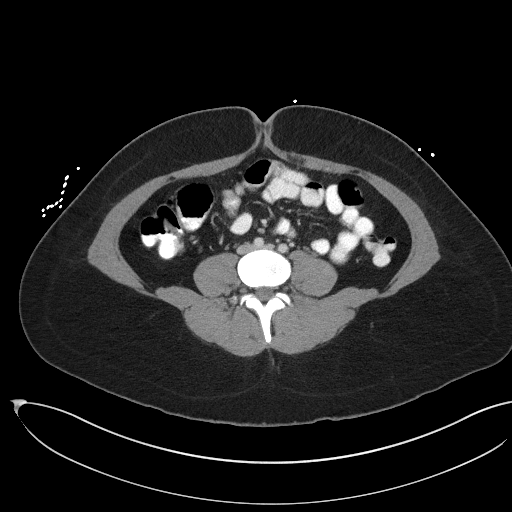
[im 57/98  soft-tissue]
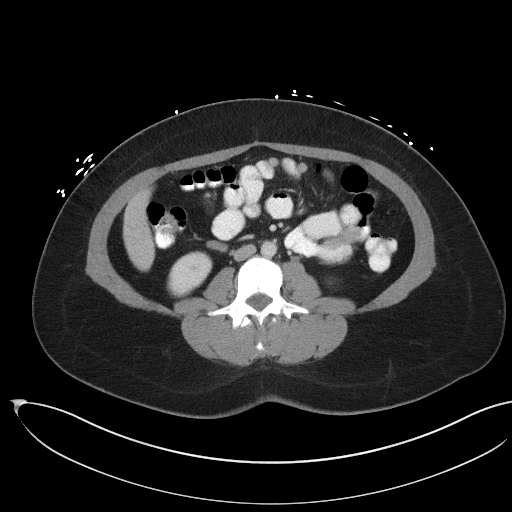
[im 65/98  soft-tissue]
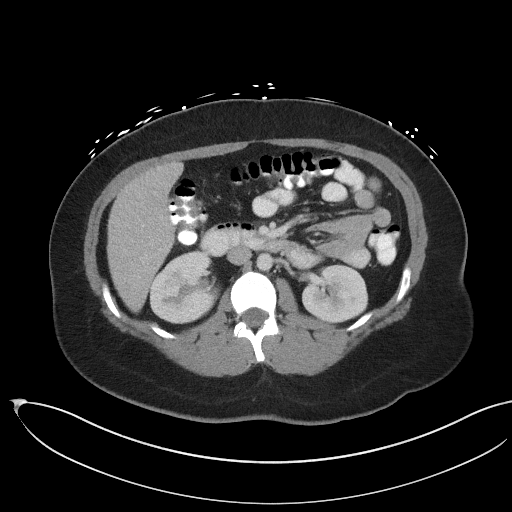
[im 65/98  bone]
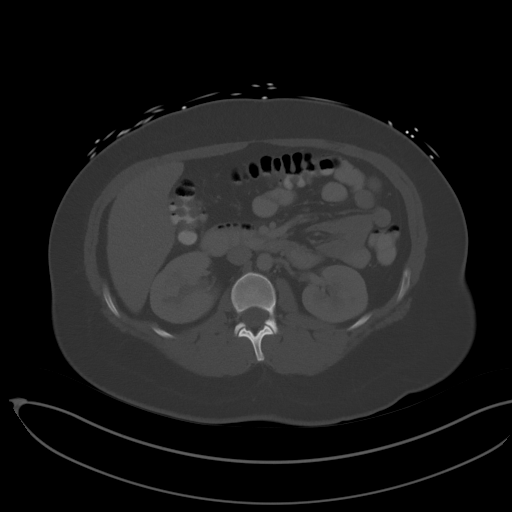
[im 69/98  soft-tissue]
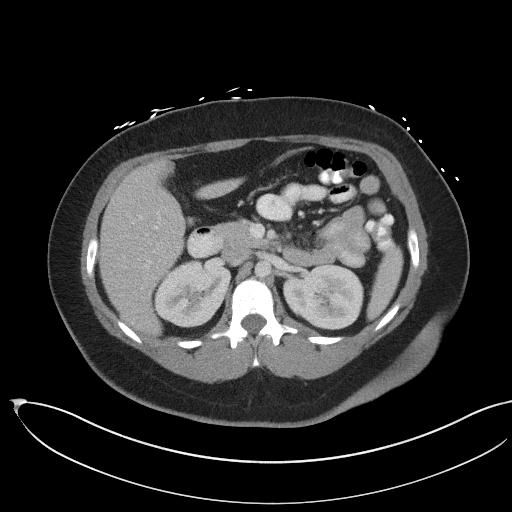
[im 77/98  soft-tissue]
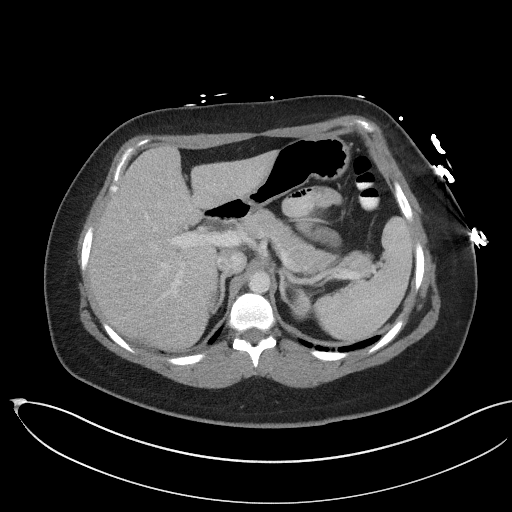
[im 85/98  soft-tissue]
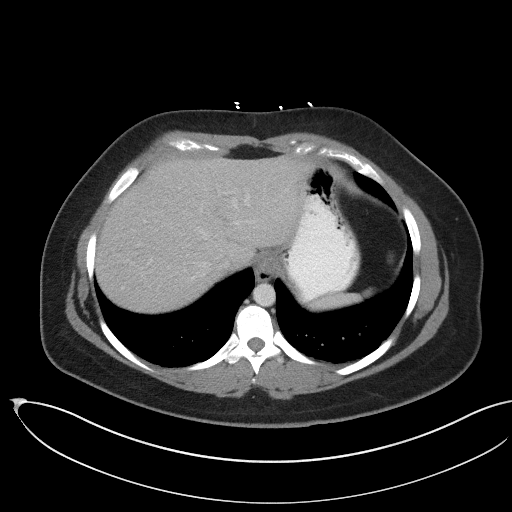
[im 93/98  soft-tissue]
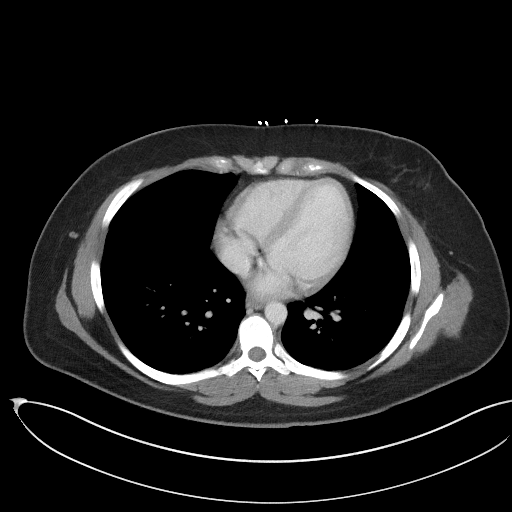

[Series 5: coronal st · coronal · 0.68mm/px · 3 of 81 slices shown]
[im 27/81  soft-tissue]
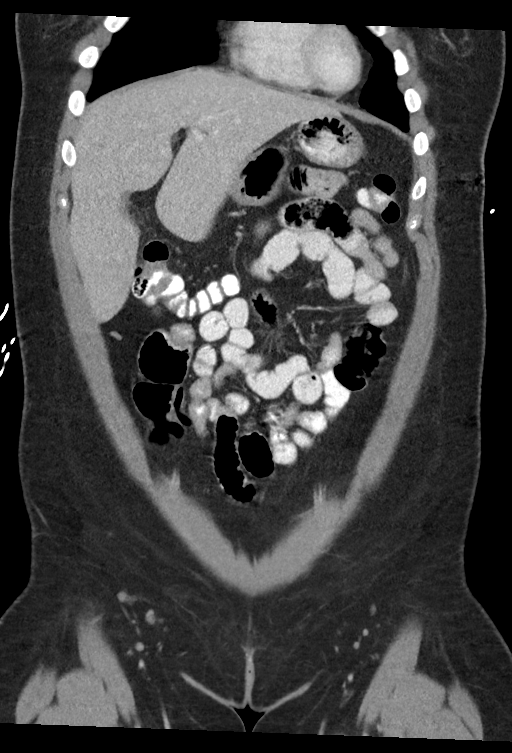
[im 36/81  soft-tissue]
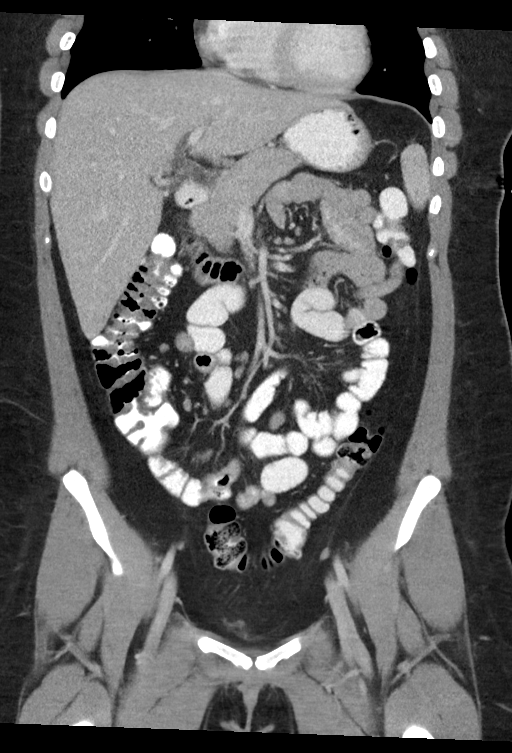
[im 45/81  soft-tissue]
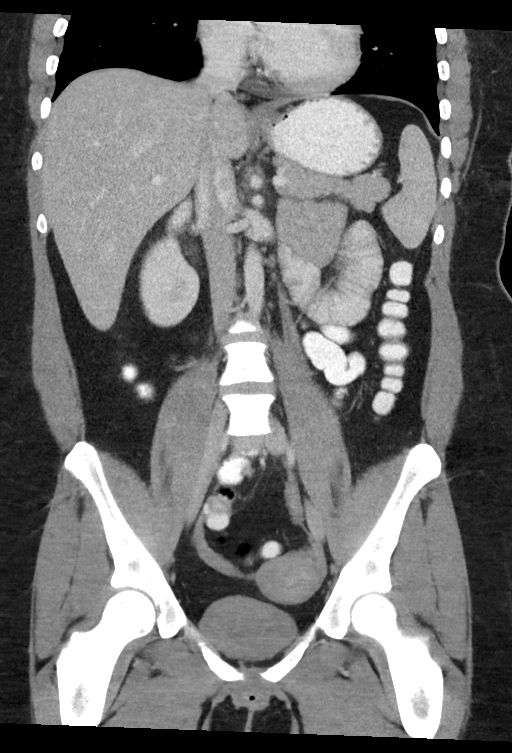

[16 of 46 positions shown; findings below may reference images not displayed]

FINDINGS: Lower chest: No acute abnormality.

Hepatobiliary: No focal liver abnormality is seen. Status post
cholecystectomy. Mild prominence of the common bile duct is likely
compensatory postcholecystectomy. Mild fat stranding in the
gallbladder fossa is likely postsurgical.

Pancreas: Unremarkable. No pancreatic ductal dilatation or
surrounding inflammatory changes.

Spleen: Normal in size without focal abnormality.

Adrenals/Urinary Tract: Adrenal glands are unremarkable. Kidneys are
normal, without renal calculi, focal lesion, or hydronephrosis.
Bladder is unremarkable.

Stomach/Bowel: Stomach is within normal limits. Appendix appears
normal. No evidence of bowel wall thickening, distention, or
inflammatory changes.

Vascular/Lymphatic: No significant vascular findings are present. No
enlarged abdominal or pelvic lymph nodes.

Reproductive: Uterus and bilateral adnexa are unremarkable.

Other: No abdominal wall hernia or abnormality. No abdominopelvic
ascites.

Musculoskeletal: No acute or significant osseous findings.
IMPRESSION: No acute process identified.  Status post cholecystectomy.

By: Jazihel Barbie M.D.

## 2018-01-30 ENCOUNTER — Emergency Department: Payer: Medicaid Other

## 2018-01-30 ENCOUNTER — Emergency Department
Admission: EM | Admit: 2018-01-30 | Discharge: 2018-01-30 | Disposition: A | Discharge status: Home / Self Care | Payer: Medicaid Other | Attending: Emergency Medicine | Admitting: Emergency Medicine

## 2018-01-30 DIAGNOSIS — M7989 Other specified soft tissue disorders: Secondary | ICD-10-CM | POA: Insufficient documentation

## 2018-01-30 DIAGNOSIS — L03115 Cellulitis of right lower limb: Secondary | ICD-10-CM | POA: Diagnosis not present

## 2018-01-30 DIAGNOSIS — M79661 Pain in right lower leg: Secondary | ICD-10-CM | POA: Diagnosis present

## 2018-01-30 MED ORDER — SULFAMETHOXAZOLE-TRIMETHOPRIM 800-160 MG PO TABS: 1.0000 | ORAL_TABLET | Freq: Two times a day (BID) | ORAL | 0 refills | Status: DC

## 2018-01-30 NOTE — ED Triage Notes (Signed)
Pt reports that she fell off of a wagon 2 weeks ago, pt states she thought it was a regular bruise.  Pt reports increased swelling and pain since then.  Pt states there is sometimes and "itching" sensation to her foot when she puts pressure on it.  Pt is A&Ox4, in NAD.

## 2018-01-30 NOTE — Discharge Instructions (Addendum)
Follow-up with your regular doctor or return to emergency department if you are worsening.  Pay close attention to your calf muscle.  If you begin to have more pain in the back of your leg please return the emergency department as soon as possible.  If you develop numbness and tingling in the foot please return the emergency department.  Take medication as prescribed.  Keep the foot elevated above your heart for his much as possible.  Apply ice to the area

## 2018-01-30 NOTE — ED Provider Notes (Signed)
[facility] Emergency Department Provider Note  ____________________________________________   First MD Initiated Contact with Patient 01/30/18 2127     (approximate)  I have reviewed the triage vital signs and the nursing notes.   HISTORY  Chief Complaint Fall and Leg Swelling    HPI Dominique HANNAN is a 27 y.o. female presents to the emergency department complaining of right leg pain.  She states that she fell off of a homemade wagon a few weeks ago.  She states she thought she was just bruised up but the bruise on the anterior part of her leg is gotten red hot and swollen.  She states she has been hurting in the calf.  Her foot feels numb and has some tingling.  She also has an itching sensation.  States the leg feels a little heavy.  She denies chest pain or shortness of breath.  She is on birth control, she states she has the implant.  She is not a smoker.  No recent travel  Past Medical History:  Diagnosis Date  . Gallstones     Patient Active Problem List   Diagnosis Date Noted  . 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 CHOLECYSTECTOMY    . NO PAST SURGERIES      Prior to Admission medications   Medication Sig Start Date End Date Taking? Authorizing Provider  sulfamethoxazole-trimethoprim (BACTRIM DS,SEPTRA DS) 800-160 MG tablet Take 1 tablet by mouth 2 (two) times daily. 01/30/18   Faythe Ghee, PA-C    Allergies Patient has no known allergies.  Family History  Problem Relation Age of Onset  . Heart disease Mother   . Hypertension Mother   . Heart disease Father   . Hypertension Father     Social History Social History   Tobacco Use  . Smoking status: Never Smoker  . Smokeless tobacco: Never Used  Substance Use Topics  . Alcohol use: Yes    Comment: occas  . Drug use: No    Review of Systems  Constitutional: No fever/chills Eyes: No visual changes. ENT: No sore  throat. Respiratory: Denies cough Genitourinary: Negative for dysuria. Musculoskeletal: Negative for back pain.  Positive for right leg pain Skin: Negative for rash.    ____________________________________________   PHYSICAL EXAM:  VITAL SIGNS: ED Triage Vitals  Enc Vitals Group     BP 01/30/18 2107 124/74     Pulse Rate 01/30/18 2107 80     Resp 01/30/18 2107 16     Temp 01/30/18 2107 98.2 F (36.8 C)     Temp Source 01/30/18 2107 Oral     SpO2 01/30/18 2107 99 %     Weight 01/30/18 2109 180 lb (81.6 kg)     Height --      Head Circumference --      Peak Flow --      Pain Score 01/30/18 2109 8     Pain Loc --      Pain Edu? --      Excl. in GC? --     Constitutional: Alert and oriented. Well appearing and in no acute distress. Eyes: Conjunctivae are normal.  Head: Atraumatic. Nose: No congestion/rhinnorhea. Mouth/Throat: Mucous membranes are moist.   Cardiovascular: Normal rate, regular rhythm.  Heart sounds are normal Respiratory: Normal respiratory effort.  No retractions, lungs clear to auscultation GU: deferred Musculoskeletal: FROM all extremities, warm and well perfused.  The right lower leg has  a red swollen area anteriorly.  The area is tender to palpation.  There is a positive Homans sign.  Patient is tender in the calf.  The thigh is nontender.  She is neurovascularly intact. Neurologic:  Normal speech and language.  Skin:  Skin is warm, dry and intact. No rash noted.  Positive for redness and swelling on the anterior portion of the right lower leg Psychiatric: Mood and affect are normal. Speech and behavior are normal.  ____________________________________________   LABS (all labs ordered are listed, but only abnormal results are displayed)  Labs Reviewed - No data to display ____________________________________________   ____________________________________________  RADIOLOGY  X-ray of the right tib-fib is negative for fracture Ultrasound of  the right lower leg is negative for abscess  ____________________________________________   PROCEDURES  Procedure(s) performed: No  Procedures    ____________________________________________   INITIAL IMPRESSION / ASSESSMENT AND PLAN / ED COURSE  Pertinent labs & imaging results that were available during my care of the patient were reviewed by me and considered in my medical decision making (see chart for details).  Patient is a 27 year old female presents emergency department complaining of right leg pain and heaviness.  She states that she fell off of a wagon about 2 weeks ago and thought it was a regular bruise.  The bruises continued to get larger and is warm to touch and painful.  She is also having calf pain.  On physical exam there is tenderness at the anterior midportion of the tib-fib.  The calf is tender and there is a positive Homans sign.  No cord was palpated  X-ray of the right tib-fib is negative for fracture Ultrasound of the right lower leg    ----------------------------------------- 11:00 PM on 01/30/2018 -----------------------------------------  Ultrasound of the right leg shows a healing hematoma.  No abscess is noted.  No clot is noted  Discussed the results with patient.  The ultrasound tech did not ultrasound the calf muscle.  Discussed signs and symptoms of a DVT.  I told the patient that she could return to ultrasound to have the calf muscle evaluated.  She states she does not feel like it is a blood clot at this time and is refusing the additional ultrasound.  The patient states she feels a little sore there but she thinks its from pulling the tissue at the front.  She states that she will return if the pain becomes worse in the calf.  She was given strict instructions to return if there is any redness or swelling in the calf or any increased pain.  If she has numbness or tingling or feels like her toes are turning blue she also needs to return to the  emergency department.  The patient states she understands and will comply with instructions.  She was instructed to start taking an aspirin a day for the next month.  She was given a prescription for Septra DS 1 twice daily for 7 days.  She was discharged in stable condition  As part of my medical decision making, I reviewed the following data within the electronic MEDICAL RECORD NUMBER Nursing notes reviewed and incorporated, Old chart reviewed, Radiograph reviewed ultrasound of the right lower leg is negative for abscess, Notes from prior ED visits and [place] Controlled Substance Database  ____________________________________________   FINAL CLINICAL IMPRESSION(S) / ED DIAGNOSES  Final diagnoses:  Leg swelling  Cellulitis of leg, right      NEW MEDICATIONS STARTED DURING THIS VISIT:  New Prescriptions  SULFAMETHOXAZOLE-TRIMETHOPRIM (BACTRIM DS,SEPTRA DS) 800-160 MG TABLET    Take 1 tablet by mouth 2 (two) times daily.     Note:  This document was prepared using Dragon voice recognition software and may include unintentional dictation errors.    Faythe Ghee, PA-C 01/30/18 2304    Jeanmarie Plant, MD 01/30/18 [phone]

## 2018-01-30 NOTE — ED Notes (Signed)
PA at bedside.

## 2018-01-30 NOTE — ED Notes (Addendum)
Patient returned from Ultrasound. 

## 2018-01-30 NOTE — ED Notes (Signed)
Patient transported to Ultrasound 

## 2018-01-30 NOTE — ED Notes (Signed)
Bruising noted to the anterior portion of the right leg.  Patient in NAD at this time.

## 2018-02-01 ENCOUNTER — Emergency Department
Admission: EM | Admit: 2018-02-01 | Discharge: 2018-02-01 | Disposition: A | Discharge status: Home / Self Care | Payer: Medicaid Other | Attending: Emergency Medicine | Admitting: Emergency Medicine

## 2018-02-01 ENCOUNTER — Encounter: Payer: Self-pay | Admitting: Emergency Medicine

## 2018-02-01 DIAGNOSIS — R2241 Localized swelling, mass and lump, right lower limb: Secondary | ICD-10-CM | POA: Diagnosis present

## 2018-02-01 DIAGNOSIS — Z9049 Acquired absence of other specified parts of digestive tract: Secondary | ICD-10-CM | POA: Diagnosis not present

## 2018-02-01 DIAGNOSIS — L03115 Cellulitis of right lower limb: Secondary | ICD-10-CM | POA: Diagnosis not present

## 2018-02-01 LAB — COMPREHENSIVE METABOLIC PANEL
ALK PHOS: 93 U/L (ref 38–126)
ALT: 18 U/L (ref 14–54)
AST: 19 U/L (ref 15–41)
Albumin: 4.1 g/dL (ref 3.5–5.0)
Anion gap: 6 (ref 5–15)
BUN: 14 mg/dL (ref 6–20)
CALCIUM: 8.6 mg/dL — AB (ref 8.9–10.3)
CO2: 25 mmol/L (ref 22–32)
CREATININE: 0.76 mg/dL (ref 0.44–1.00)
Chloride: 102 mmol/L (ref 101–111)
Glucose, Bld: 85 mg/dL (ref 65–99)
Potassium: 3.7 mmol/L (ref 3.5–5.1)
Sodium: 133 mmol/L — ABNORMAL LOW (ref 135–145)
Total Bilirubin: 0.4 mg/dL (ref 0.3–1.2)
Total Protein: 8.3 g/dL — ABNORMAL HIGH (ref 6.5–8.1)

## 2018-02-01 LAB — CBC WITH DIFFERENTIAL/PLATELET
Basophils Absolute: 0.1 10*3/uL (ref 0–0.1)
Basophils Relative: 1 %
EOS ABS: 0.2 10*3/uL (ref 0–0.7)
Eosinophils Relative: 2 %
HCT: 38.9 % (ref 35.0–47.0)
HEMOGLOBIN: 13.5 g/dL (ref 12.0–16.0)
Lymphocytes Relative: 32 %
Lymphs Abs: 3.4 10*3/uL (ref 1.0–3.6)
MCH: 30.3 pg (ref 26.0–34.0)
MCHC: 34.7 g/dL (ref 32.0–36.0)
MCV: 87.2 fL (ref 80.0–100.0)
Monocytes Absolute: 1.1 10*3/uL — ABNORMAL HIGH (ref 0.2–0.9)
Monocytes Relative: 10 %
NEUTROS PCT: 55 %
Neutro Abs: 6 10*3/uL (ref 1.4–6.5)
Platelets: 324 10*3/uL (ref 150–440)
RBC: 4.46 MIL/uL (ref 3.80–5.20)
RDW: 13.3 % (ref 11.5–14.5)
WBC: 10.7 10*3/uL (ref 3.6–11.0)

## 2018-02-01 MED ORDER — LIDOCAINE-EPINEPHRINE (PF) 1 %-1:200000 IJ SOLN
INTRAMUSCULAR | Status: AC
  Filled 2018-02-01: qty 30

## 2018-02-01 MED ORDER — VANCOMYCIN HCL IN DEXTROSE 1-5 GM/200ML-% IV SOLN
INTRAVENOUS | Status: AC
  Filled 2018-02-01: qty 200

## 2018-02-01 MED ORDER — MUPIROCIN 2 % EX OINT: TOPICAL_OINTMENT | CUTANEOUS | 0 refills | Status: AC

## 2018-02-01 MED ORDER — OXYCODONE-ACETAMINOPHEN 5-325 MG PO TABS
ORAL_TABLET | ORAL | Status: AC
  Filled 2018-02-01: qty 1

## 2018-02-01 MED ORDER — VANCOMYCIN HCL IN DEXTROSE 1-5 GM/200ML-% IV SOLN
1000.0000 mg | Freq: Once | INTRAVENOUS | Status: AC
  Administered 2018-02-01: 1000 mg via INTRAVENOUS

## 2018-02-01 MED ORDER — DIPHENHYDRAMINE HCL 50 MG/ML IJ SOLN
INTRAMUSCULAR | Status: AC
  Filled 2018-02-01: qty 1

## 2018-02-01 MED ORDER — DIPHENHYDRAMINE HCL 50 MG/ML IJ SOLN
25.0000 mg | Freq: Once | INTRAMUSCULAR | Status: AC
  Administered 2018-02-01: 25 mg via INTRAVENOUS

## 2018-02-01 MED ORDER — OXYCODONE-ACETAMINOPHEN 5-325 MG PO TABS: 1.0000 | ORAL_TABLET | Freq: Three times a day (TID) | ORAL | 0 refills | Status: AC | PRN

## 2018-02-01 MED ORDER — SULFAMETHOXAZOLE-TRIMETHOPRIM 800-160 MG PO TABS: 2.0000 | ORAL_TABLET | Freq: Two times a day (BID) | ORAL | 0 refills | Status: AC

## 2018-02-01 MED ORDER — OXYCODONE-ACETAMINOPHEN 5-325 MG PO TABS
2.0000 | ORAL_TABLET | Freq: Once | ORAL | Status: AC
  Administered 2018-02-01: 2 via ORAL

## 2018-02-01 MED ORDER — LIDOCAINE-EPINEPHRINE 1 %-1:100000 IJ SOLN
10.0000 mL | Freq: Once | INTRAMUSCULAR | Status: DC
  Filled 2018-02-01: qty 10

## 2018-02-01 NOTE — ED Notes (Signed)
Ambulated to restroom. NAD. 

## 2018-02-01 NOTE — ED Provider Notes (Signed)
[facility] Emergency Department Provider Note       Time seen: ----------------------------------------- 2:59 PM on 02/01/2018 -----------------------------------------   I have reviewed the triage vital signs and the nursing notes.  HISTORY   Chief Complaint Leg Swelling    HPI Dominique Shelton is a 27 y.o. female with a history of gallstones who presents to the ED for pain and swelling of the right leg.  Patient arrives by private vehicle for persistent pain.  She was seen here early last week and completed x-rays and ultrasound.  No clot was seen but she was told it may be from infection so she was started on Septra.  She reports starting on the Septra yesterday and was told that if her symptoms worsen she should return.  She has significant pain with walking.  X-rays and ultrasound were negative on the previous visit.  Past Medical History:  Diagnosis Date  . Gallstones     Patient Active Problem List   Diagnosis Date Noted  . 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 CHOLECYSTECTOMY    . NO PAST SURGERIES      Allergies Patient has no known allergies.  Social History Social History   Tobacco Use  . Smoking status: Never Smoker  . Smokeless tobacco: Never Used  Substance Use Topics  . Alcohol use: Yes    Comment: occas  . Drug use: No   Review of Systems Constitutional: Negative for fever. Cardiovascular: Negative for chest pain. Respiratory: Negative for shortness of breath. Gastrointestinal: Negative for abdominal pain, vomiting and diarrhea. Musculoskeletal: Positive for right leg pain Skin: Positive for redness and swelling  to the right leg Neurological: Negative for headaches, focal weakness or numbness.  All systems negative/normal/unremarkable except as stated in the HPI  ____________________________________________   PHYSICAL EXAM:  VITAL SIGNS: ED Triage Vitals  Enc  Vitals Group     BP 02/01/18 1357 131/70     Pulse Rate 02/01/18 1357 77     Resp 02/01/18 1357 17     Temp 02/01/18 1357 98.1 F (36.7 C)     Temp Source 02/01/18 1357 Oral     SpO2 02/01/18 1357 98 %     Weight 02/01/18 1358 180 lb (81.6 kg)     Height 02/01/18 1358 5\' 2"  (1.575 m)     Head Circumference --      Peak Flow --      Pain Score 02/01/18 1358 8     Pain Loc --      Pain Edu? --      Excl. in GC? --    Constitutional: Alert and oriented. Well appearing and in no distress. Eyes: Conjunctivae are normal. Normal extraocular movements. Cardiovascular: Normal rate, regular rhythm. No murmurs, rubs, or gallops. Respiratory: Normal respiratory effort without tachypnea nor retractions. Breath sounds are clear and equal bilaterally. No wheezes/rales/rhonchi. Gastrointestinal: Soft and nontender. Normal bowel sounds Musculoskeletal: Tenderness with erythema and induration over the mid to distal anterior tibial region there is erythema that extends laterally and medially from this area. Neurologic:  Normal speech and language. No gross focal neurologic deficits are appreciated.  Skin: Erythema and induration as dictated above on the right lower leg Psychiatric: Mood and affect are normal. Speech and behavior are normal.  ____________________________________________  ED COURSE:  As part of my medical decision making, I reviewed the following data within the electronic MEDICAL RECORD NUMBER History obtained from family  if available, nursing notes, old chart and ekg, as well as notes from prior ED visits. Patient presented for worsening right leg pain and swelling, we will assess with labs, give IV antibiotics and likely perform incision and drainage .Marland KitchenIncision and Drainage Date/Time: 02/01/2018 3:40 PM Performed by: Emily Filbert, MD Authorized by: Emily Filbert, MD   Consent:    Consent obtained:  Verbal   Consent given by:  Patient   Risks discussed:  Bleeding,  infection, incomplete drainage and pain   Alternatives discussed:  Alternative treatment, delayed treatment and observation Location:    Type:  Abscess   Location:  Lower extremity   Lower extremity location:  Leg   Leg location:  R lower leg Anesthesia (see MAR for exact dosages):    Anesthesia method:  Local infiltration   Local anesthetic:  Lidocaine 1% WITH epi Procedure type:    Complexity:  Simple Procedure details:    Needle aspiration: yes     Incision types:  Stab incision   Scalpel blade:  11   Drainage:  Bloody   Drainage amount:  Scant   Wound treatment:  Wound left open   Packing materials:  None Post-procedure details:    Patient tolerance of procedure:  Tolerated well, no immediate complications   ____________________________________________   LABS (pertinent positives/negatives)  Labs Reviewed  COMPREHENSIVE METABOLIC PANEL - Abnormal; Notable for the following components:      Result Value   Sodium 133 (*)    Calcium 8.6 (*)    Total Protein 8.3 (*)    All other components within normal limits  CBC WITH DIFFERENTIAL/PLATELET - Abnormal; Notable for the following components:   Monocytes Absolute 1.1 (*)    All other components within normal limits  URINALYSIS, COMPLETE (UACMP) WITH MICROSCOPIC  POC URINE PREG, ED   ____________________________________________  DIFFERENTIAL DIAGNOSIS   Cellulitis, abscess, osteomyelitis unlikely, DVT unlikely  FINAL ASSESSMENT AND PLAN  Cellulitis   Plan: The patient had presented for worsening right leg pain and swelling. Patient's labs were grossly unremarkable. Patient's imaging from the previous visit were unremarkable.  I performed incision and drainage to ensure no abscess was forming and there was only bloody drainage.  It was left open, she did receive IV vancomycin here and I will double her Septra DS prescription as well as give her pain medicine.  She is advised to follow-up in 2 days for  recheck.   Ulice Dash, MD   Note: This note was generated in part or whole with voice recognition software. Voice recognition is usually quite accurate but there are transcription errors that can and very often do occur. I apologize for any typographical errors that were not detected and corrected.     Emily Filbert, MD 02/01/18 [phone]

## 2018-02-01 NOTE — ED Triage Notes (Signed)
Pt comes into the ED via POV c/o swelling and pain to the right leg.  Patient was seen here earlier last week and completed xrays and an US.  No clot was seen, but she was told it could be the start to an infection and if the pain got worse then she should return.  Patient has altered gait while walking to the triage room at this time.  Patient states the leg is red and hot to the touch as well.

## 2018-02-03 ENCOUNTER — Emergency Department: Payer: Medicaid Other

## 2018-02-03 ENCOUNTER — Emergency Department
Admission: EM | Admit: 2018-02-03 | Discharge: 2018-02-03 | Disposition: A | Discharge status: Home / Self Care | Payer: Medicaid Other | Attending: Student in an Organized Health Care Education/Training Program | Admitting: Student in an Organized Health Care Education/Training Program

## 2018-02-03 DIAGNOSIS — R2241 Localized swelling, mass and lump, right lower limb: Secondary | ICD-10-CM | POA: Diagnosis present

## 2018-02-03 DIAGNOSIS — R52 Pain, unspecified: Secondary | ICD-10-CM

## 2018-02-03 DIAGNOSIS — L03115 Cellulitis of right lower limb: Secondary | ICD-10-CM | POA: Diagnosis not present

## 2018-02-03 LAB — COMPREHENSIVE METABOLIC PANEL WITH GFR
ALT: 55 U/L — ABNORMAL HIGH (ref 14–54)
AST: 34 U/L (ref 15–41)
Albumin: 3.9 g/dL (ref 3.5–5.0)
Alkaline Phosphatase: 81 U/L (ref 38–126)
Anion gap: 7 (ref 5–15)
BUN: 16 mg/dL (ref 6–20)
CO2: 22 mmol/L (ref 22–32)
Calcium: 8.7 mg/dL — ABNORMAL LOW (ref 8.9–10.3)
Chloride: 102 mmol/L (ref 101–111)
Creatinine, Ser: 0.77 mg/dL (ref 0.44–1.00)
GFR calc Af Amer: >60 mL/min
GFR calc non Af Amer: >60 mL/min
Glucose, Bld: 102 mg/dL — ABNORMAL HIGH (ref 65–99)
Potassium: 3.6 mmol/L (ref 3.5–5.1)
Sodium: 131 mmol/L — ABNORMAL LOW (ref 135–145)
Total Bilirubin: 0.4 mg/dL (ref 0.3–1.2)
Total Protein: 8 g/dL (ref 6.5–8.1)

## 2018-02-03 LAB — CBC WITH DIFFERENTIAL/PLATELET
BASOS ABS: 0.1 10*3/uL (ref 0–0.1)
Basophils Relative: 1 %
EOS ABS: 0.2 10*3/uL (ref 0–0.7)
Eosinophils Relative: 2 %
HCT: 39.4 % (ref 35.0–47.0)
HEMOGLOBIN: 13.4 g/dL (ref 12.0–16.0)
Lymphocytes Relative: 34 %
Lymphs Abs: 3.3 10*3/uL (ref 1.0–3.6)
MCH: 29.6 pg (ref 26.0–34.0)
MCHC: 34.1 g/dL (ref 32.0–36.0)
MCV: 86.8 fL (ref 80.0–100.0)
MONO ABS: 0.7 10*3/uL (ref 0.2–0.9)
MONOS PCT: 8 %
NEUTROS PCT: 55 %
Neutro Abs: 5.4 10*3/uL (ref 1.4–6.5)
Platelets: 303 10*3/uL (ref 150–440)
RBC: 4.54 MIL/uL (ref 3.80–5.20)
RDW: 12.9 % (ref 11.5–14.5)
WBC: 9.7 10*3/uL (ref 3.6–11.0)

## 2018-02-03 MED ORDER — KETOROLAC TROMETHAMINE 30 MG/ML IJ SOLN
15.0000 mg | Freq: Once | INTRAMUSCULAR | Status: AC
  Administered 2018-02-03: 15 mg via INTRAMUSCULAR
  Filled 2018-02-03: qty 1

## 2018-02-03 NOTE — ED Notes (Signed)
Patient to room 11 via WC, Jerrie RN aware of placement.  Patient alert and oriented, NAD.

## 2018-02-03 NOTE — ED Triage Notes (Addendum)
Patient c/o right leg pain and infection. Patient seen in this ED the last 2 days for the same. Patient reports both the area of redness and the pain has increased from yesterday.

## 2018-02-03 NOTE — ED Provider Notes (Signed)
[facility] Emergency Department Provider Note    First MD Initiated Contact with Patient 02/03/18 [phone]     (approximate)  I have reviewed the triage vital signs and the nursing notes.   HISTORY  Chief Complaint Cellulitis    HPI Dominique Shelton is a 27 y.o. female presents for ER follow-up after having I&D of a right lower extremity cellulitis and swelling.  Patient with no fevers.  She was given prescription for Bactrim yesterday and is taking 2 pills thus far.  Feels that the redness and swelling has gotten better but she still having pain.  Denies any interval trauma.  No nausea or vomiting.  States the pain is moderate and aching in nature.  Past Medical History:  Diagnosis Date  . Gallstones    Family History  Problem Relation Age of Onset  . Heart disease Mother   . Hypertension Mother   . Heart disease Father   . Hypertension Father    Past Surgical History:  Procedure Laterality Date  . CHOLECYSTECTOMY    . NO PAST SURGERIES     Patient Active Problem List   Diagnosis Date Noted  . 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 Authorizing Provider  mupirocin ointment (BACTROBAN) 2 % Apply to affected area 3 times daily 02/01/18 02/01/19  Emily Filbert, MD  oxyCODONE-acetaminophen (PERCOCET) 5-325 MG tablet Take 1 tablet by mouth every 8 (eight) hours as needed. 02/01/18   Emily Filbert, MD  sulfamethoxazole-trimethoprim (BACTRIM DS) 800-160 MG tablet Take 2 tablets by mouth 2 (two) times daily. 02/01/18   Emily Filbert, MD    Allergies Patient has no known allergies.    Social History Social History   Tobacco Use  . Smoking status: Never Smoker  . Smokeless tobacco: Never Used  Substance Use Topics  . Alcohol use: Yes    Comment: occas  . Drug use: No    Review of Systems Patient denies headaches, rhinorrhea, blurry vision, numbness,  shortness of breath, chest pain, edema, cough, abdominal pain, nausea, vomiting, diarrhea, dysuria, fevers, rashes or hallucinations unless otherwise stated above in HPI. ____________________________________________   PHYSICAL EXAM:  VITAL SIGNS: Vitals:   02/03/18 0419  BP: 127/60  Pulse: 74  Resp: 17  Temp: 97.8 F (36.6 C)  SpO2: 99%    Constitutional: Alert and oriented. Well appearing and in no acute distress. Eyes: Conjunctivae are normal.  Head: Atraumatic. Nose: No congestion/rhinnorhea. Mouth/Throat: Mucous membranes are moist.   Neck: No stridor. Painless ROM.  Cardiovascular: Normal rate, regular rhythm. Grossly normal heart sounds.  Good peripheral circulation. Respiratory: Normal respiratory effort.  No retractions. Lungs CTAB. Gastrointestinal: Soft and nontender. No distention. No abdominal bruits. No CVA tenderness. Genitourinary:  Musculoskeletal: minimal RLE swelling, no crepitus,  weell healing I and D site.  No significant warmth or fluctuance noted. No joint effusions. Neurologic:  Normal speech and language. No gross focal neurologic deficits are appreciated. No facial droop Skin:  Skin is warm, dry and intact. No rash noted. Psychiatric: Mood and affect are normal. Speech and behavior are normal.  ____________________________________________   LABS (all labs ordered are listed, but only abnormal results are displayed)  Results for orders placed or performed during the hospital encounter of 02/03/18 (from the past 24 hour(s))  CBC with Differential     Status: None   Collection Time: 02/03/18  4:26 AM  Result Value Ref Range   WBC 9.7 3.6 - 11.0 K/uL   RBC 4.54 3.80 - 5.20 MIL/uL   Hemoglobin 13.4 12.0 - 16.0 g/dL   HCT 47.839.4 29.535.0 - 62.147.0 %   MCV 86.8 80.0 - 100.0 fL   MCH 29.6 26.0 - 34.0 pg   MCHC 34.1 32.0 - 36.0 g/dL   RDW 30.812.9 65.711.5 - 84.614.5 %   Platelets 303 150 - 440 K/uL   Neutrophils Relative % 55 %   Neutro Abs 5.4 1.4 - 6.5 K/uL    Lymphocytes Relative 34 %   Lymphs Abs 3.3 1.0 - 3.6 K/uL   Monocytes Relative 8 %   Monocytes Absolute 0.7 0.2 - 0.9 K/uL   Eosinophils Relative 2 %   Eosinophils Absolute 0.2 0 - 0.7 K/uL   Basophils Relative 1 %   Basophils Absolute 0.1 0 - 0.1 K/uL  Comprehensive metabolic panel     Status: Abnormal   Collection Time: 02/03/18  4:26 AM  Result Value Ref Range   Sodium 131 (L) 135 - 145 mmol/L   Potassium 3.6 3.5 - 5.1 mmol/L   Chloride 102 101 - 111 mmol/L   CO2 22 22 - 32 mmol/L   Glucose, Bld 102 (H) 65 - 99 mg/dL   BUN 16 6 - 20 mg/dL   Creatinine, Ser 9.620.77 0.44 - 1.00 mg/dL   Calcium 8.7 (L) 8.9 - 10.3 mg/dL   Total Protein 8.0 6.5 - 8.1 g/dL   Albumin 3.9 3.5 - 5.0 g/dL   AST 34 15 - 41 U/L   ALT 55 (H) 14 - 54 U/L   Alkaline Phosphatase 81 38 - 126 U/L   Total Bilirubin 0.4 0.3 - 1.2 mg/dL   GFR calc non Af Amer >60 >60 mL/min   GFR calc Af Amer >60 >60 mL/min   Anion gap 7 5 - 15   ____________________________________________ ____________________________________________   PROCEDURES  Procedure(s) performed:  Procedures    Critical Care performed: no ____________________________________________   INITIAL IMPRESSION / ASSESSMENT AND PLAN / ED COURSE  Pertinent labs & imaging results that were available during my care of the patient were reviewed by me and considered in my medical decision making (see chart for details).  DDX: Cellulitis, abscess, hematoma  Dominique Shelton is a 27 y.o. who presents to the ED with right lower extremity pain as described above.  Ultrasound shows no evidence of DVT.  Blood work is improving.  No fevers.  Cellulitis seems to be improving as well.  No identifiable abscess or fluid collection worth repeat I&D at this point.  Patient will be given IM Toradol for pain.  Discussed strict return precautions.  Will give referral to wound care if swelling and pain persists.  Discussed signs and symptoms for which she should return to the  ER.      As part of my medical decision making, I reviewed the following data within the electronic MEDICAL RECORD NUMBER Nursing notes reviewed and incorporated, Labs reviewed, notes from prior ED visits and [place] Controlled Substance Database   ____________________________________________   FINAL CLINICAL IMPRESSION(S) / ED DIAGNOSES  Final diagnoses:  Pain  Cellulitis of right leg      NEW MEDICATIONS STARTED DURING THIS VISIT:  New Prescriptions   No medications on file     Note:  This document was prepared using Dragon voice recognition software and may include unintentional dictation errors.    Willy Eddyobinson, [place] Bula, MD 02/03/18 [phone]

## 2018-02-03 NOTE — Discharge Instructions (Addendum)
Keep leg elevated.  Take motrin or alleve for pain in addition to your prescribed percocet.

## 2018-11-23 ENCOUNTER — Emergency Department (HOSPITAL_COMMUNITY)
Admission: EM | Admit: 2018-11-23 | Discharge: 2018-11-23 | Disposition: A | Discharge status: Home / Self Care | Payer: Medicaid Other | Attending: Emergency Medicine | Admitting: Emergency Medicine

## 2018-11-23 ENCOUNTER — Other Ambulatory Visit: Payer: Self-pay

## 2018-11-23 ENCOUNTER — Encounter (HOSPITAL_COMMUNITY): Payer: Self-pay | Admitting: Emergency Medicine

## 2018-11-23 DIAGNOSIS — R109 Unspecified abdominal pain: Secondary | ICD-10-CM | POA: Insufficient documentation

## 2018-11-23 DIAGNOSIS — Z5321 Procedure and treatment not carried out due to patient leaving prior to being seen by health care provider: Secondary | ICD-10-CM | POA: Diagnosis not present

## 2018-11-23 NOTE — ED Triage Notes (Signed)
Pt c/o abd pain but more severe right sided flank pain since yesterday morning, pt reports she is [redacted] weeks pregnant, has seen [facility] for same

## 2020-01-08 IMAGING — US US EXTREM LOW VENOUS*R*
1 series · 13 of 24 positions shown · non-contrast
Comparison: Right lower extremity ultrasound performed 01/30/2018

CLINICAL DATA: Subacute onset of right leg pain after fall.



[Series 1: us extrem low venous*right* · 0.07mm/px · 13 of 54 slices shown]
[im 1/54]
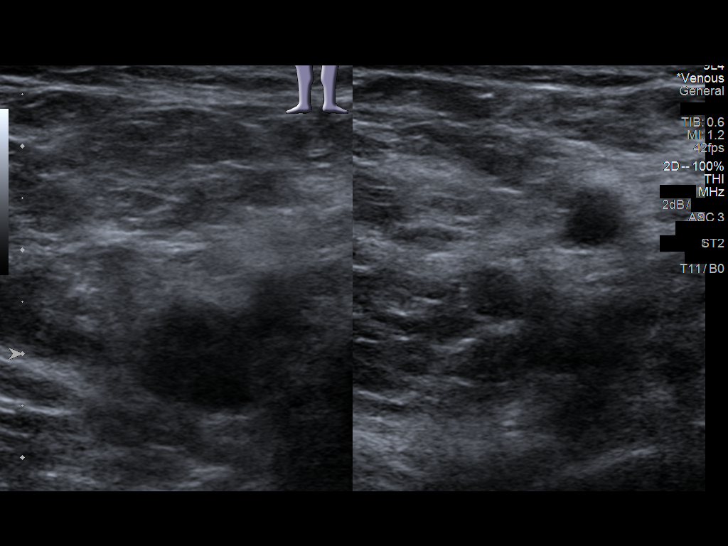
[im 5/54]
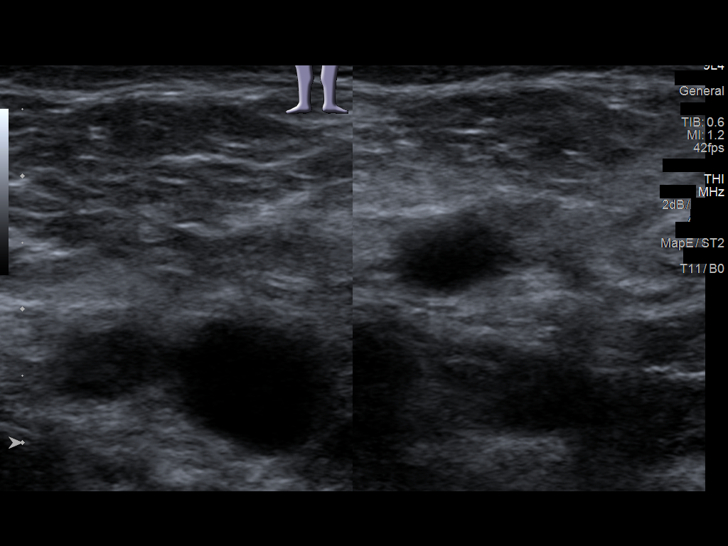
[im 10/54]
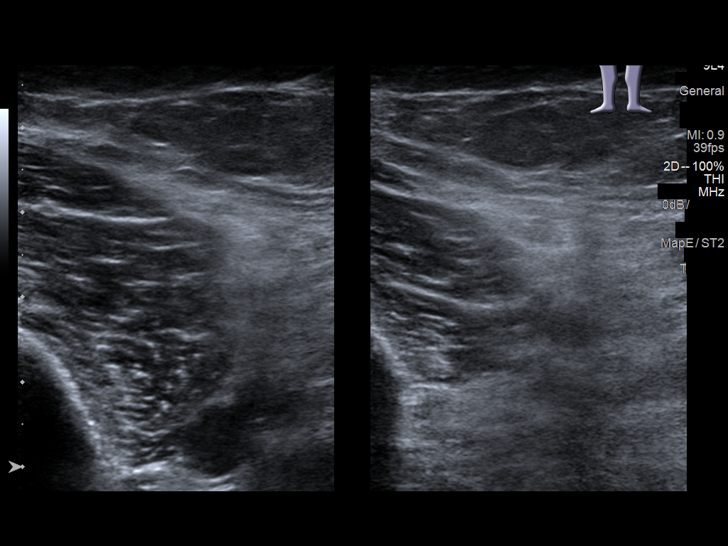
[im 14/54]
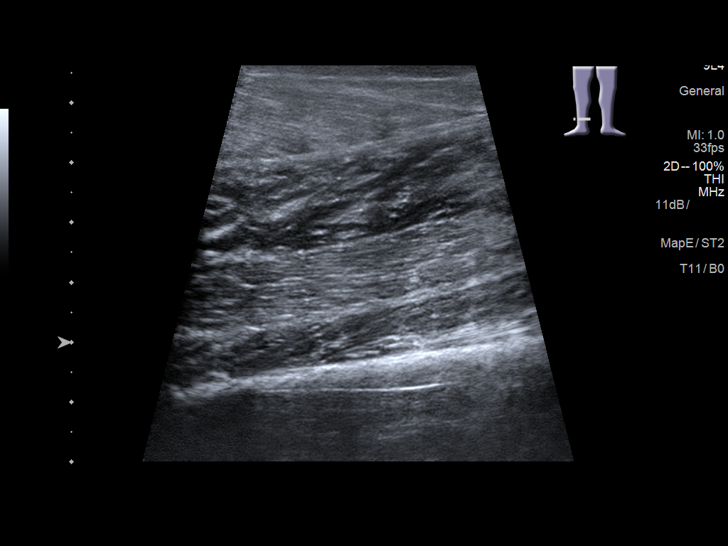
[im 19/54]
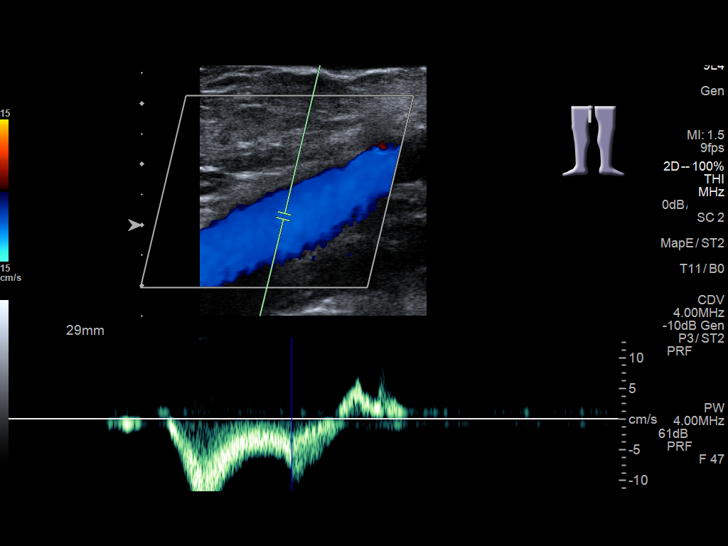
[im 24/54]
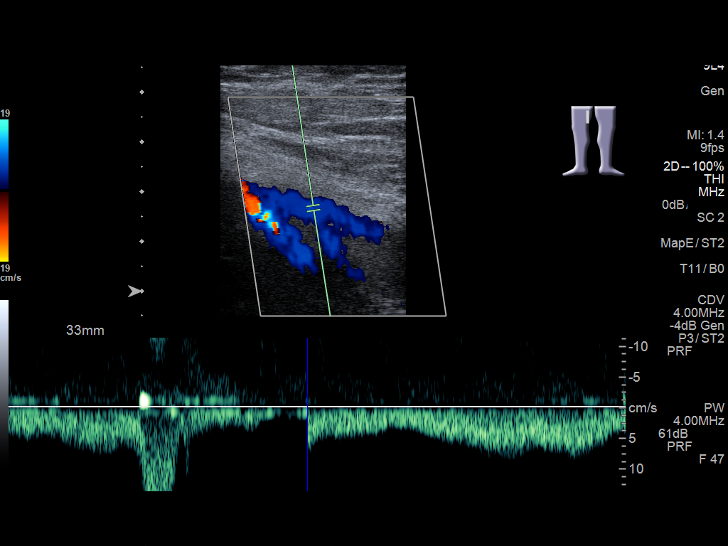
[im 28/54]
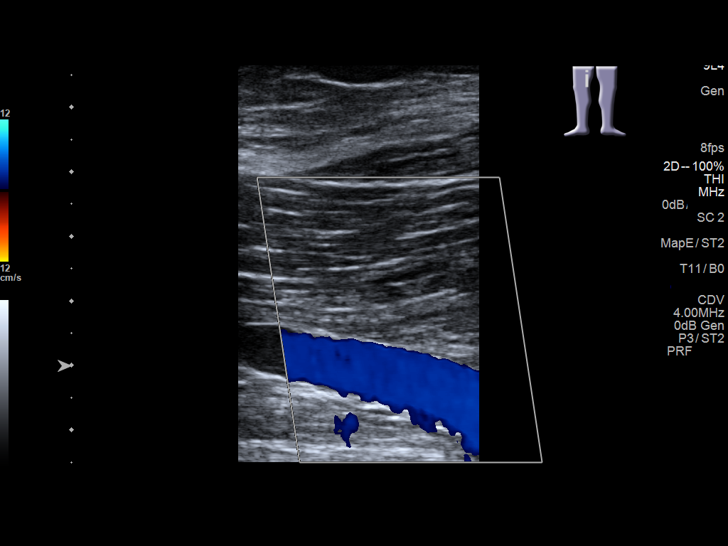
[im 30/54]
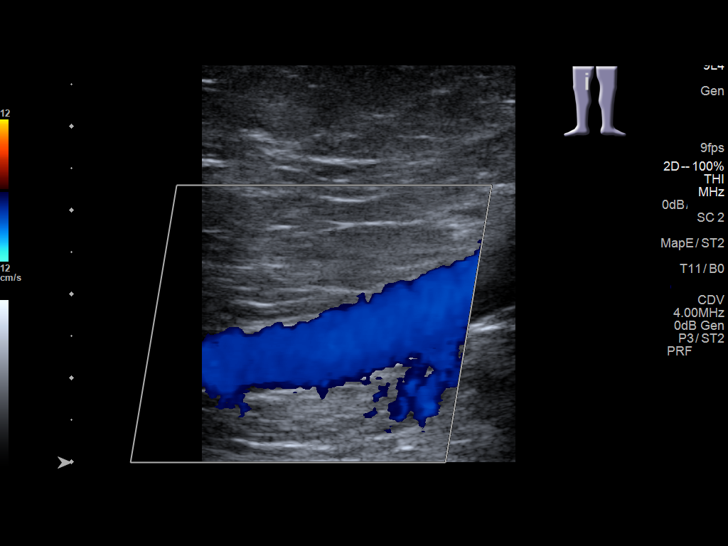
[im 35/54]
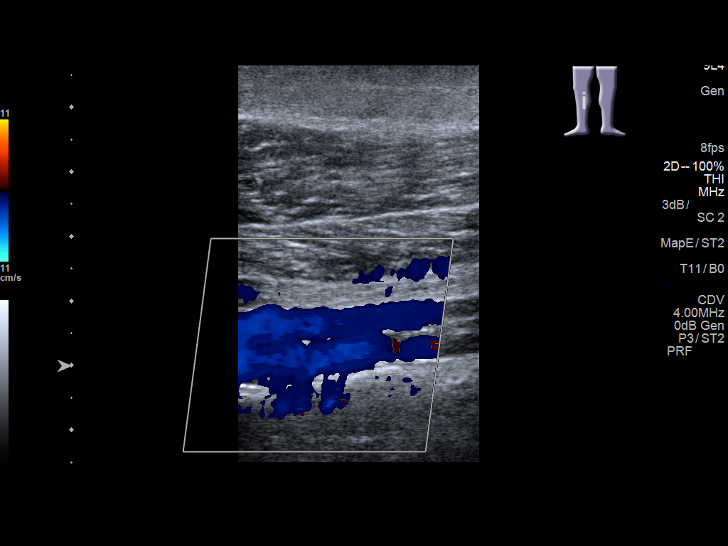
[im 40/54]
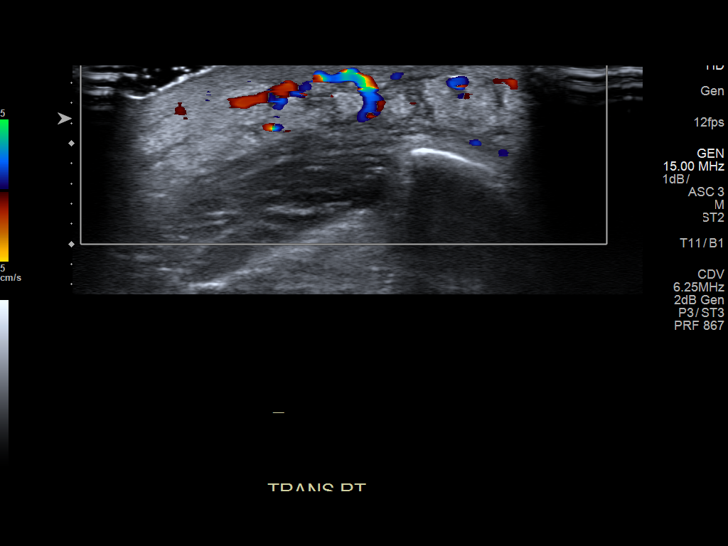
[im 44/54]
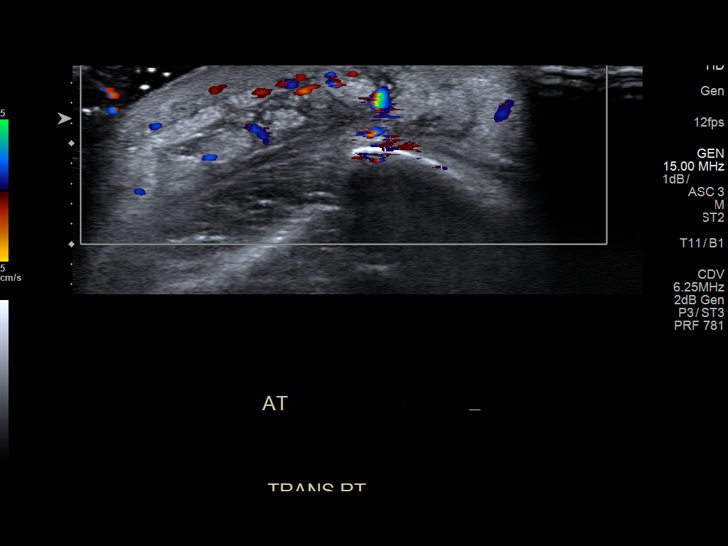
[im 49/54]
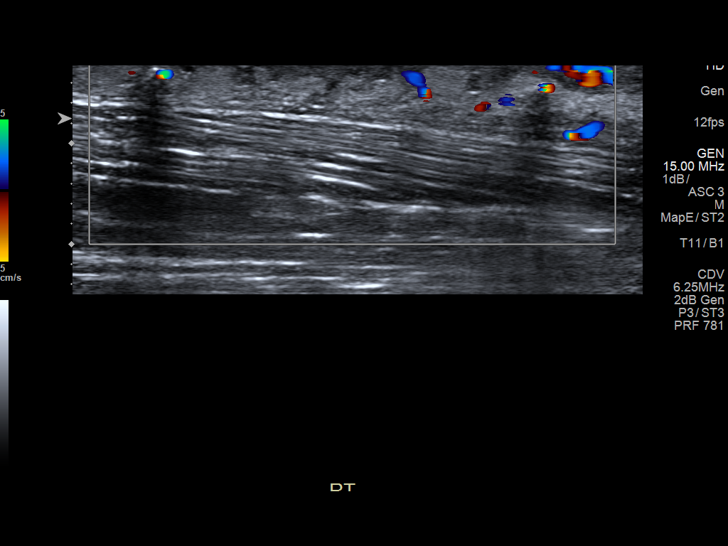
[im 54/54]
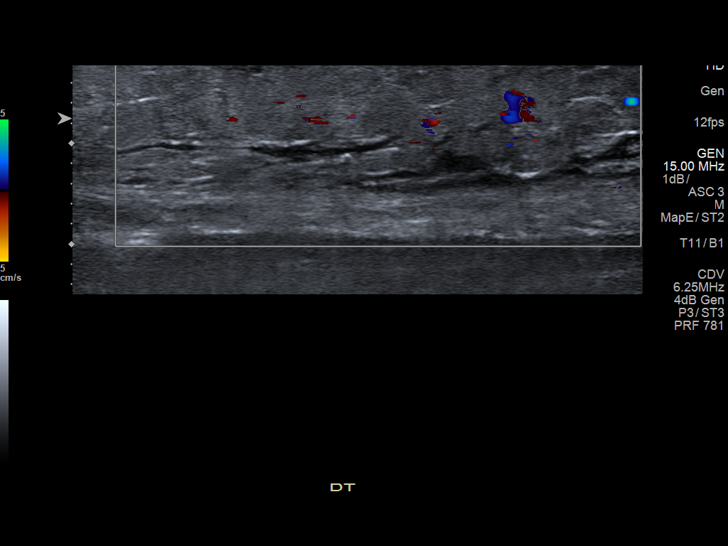

[13 of 24 positions shown; findings below may reference images not displayed]

FINDINGS: Contralateral Common Femoral Vein: Respiratory phasicity is normal
and symmetric with the symptomatic side. No evidence of thrombus.
Normal compressibility.

Common Femoral Vein: No evidence of thrombus. Normal
compressibility, respiratory phasicity and response to augmentation.

Saphenofemoral Junction: No evidence of thrombus. Normal
compressibility and flow on color Doppler imaging.

Profunda Femoral Vein: No evidence of thrombus. Normal
compressibility and flow on color Doppler imaging.

Femoral Vein: No evidence of thrombus. Normal compressibility,
respiratory phasicity and response to augmentation.

Popliteal Vein: No evidence of thrombus. Normal compressibility,
respiratory phasicity and response to augmentation.

Calf Veins: No evidence of thrombus. Normal compressibility and flow
on color Doppler imaging.

Superficial Great Saphenous Vein: No evidence of thrombus. Normal
compressibility.

Venous Reflux:  None.

Other Findings: Mild soft tissue edema is noted at the right shin,
with two small foci of soft tissue calcification.
IMPRESSION: 1. No evidence of deep venous thrombosis.
2. Mild soft tissue edema noted at the right shin.

## 2021-11-02 ENCOUNTER — Encounter (HOSPITAL_BASED_OUTPATIENT_CLINIC_OR_DEPARTMENT_OTHER): Payer: Self-pay

## 2021-11-02 DIAGNOSIS — G4733 Obstructive sleep apnea (adult) (pediatric): Secondary | ICD-10-CM

## 2021-11-02 DIAGNOSIS — R0683 Snoring: Secondary | ICD-10-CM

## 2021-11-02 DIAGNOSIS — G4710 Hypersomnia, unspecified: Secondary | ICD-10-CM

## 2021-11-02 DIAGNOSIS — R400 Somnolence: Secondary | ICD-10-CM

## 2021-12-14 ENCOUNTER — Encounter: Payer: Medicaid Other | Admitting: Neurology

## 2022-10-02 DIAGNOSIS — G4710 Hypersomnia, unspecified: Secondary | ICD-10-CM

## 2022-10-02 DIAGNOSIS — R0683 Snoring: Secondary | ICD-10-CM

## 2022-10-02 DIAGNOSIS — R0681 Apnea, not elsewhere classified: Secondary | ICD-10-CM

## 2022-10-02 DIAGNOSIS — R5383 Other fatigue: Secondary | ICD-10-CM

## 2022-11-05 ENCOUNTER — Ambulatory Visit: Payer: Medicaid Other | Attending: Family Medicine | Admitting: Pulmonary Disease

## 2022-11-05 DIAGNOSIS — G4733 Obstructive sleep apnea (adult) (pediatric): Secondary | ICD-10-CM | POA: Insufficient documentation

## 2022-11-05 DIAGNOSIS — G4710 Hypersomnia, unspecified: Secondary | ICD-10-CM | POA: Diagnosis not present

## 2022-11-05 DIAGNOSIS — R0683 Snoring: Secondary | ICD-10-CM | POA: Diagnosis present

## 2022-11-05 DIAGNOSIS — R0681 Apnea, not elsewhere classified: Secondary | ICD-10-CM

## 2022-11-05 DIAGNOSIS — R5383 Other fatigue: Secondary | ICD-10-CM | POA: Diagnosis not present

## 2022-11-06 DIAGNOSIS — R0683 Snoring: Secondary | ICD-10-CM | POA: Diagnosis not present

## 2022-11-06 NOTE — Procedures (Signed)
      Patient Name: Dominique Shelton, Dominique Shelton Date: 11/05/2022 Gender: Female D.O.B: [birth year] Age (years): 31 Referring Provider: Jerel Shepherd FNP Height (inches): 61 Interpreting Physician: Chesley Mires MD, ABSM Weight (lbs): 226 RPSGT: Rosebud Poles BMI: 24 MRN: [id number] Neck Size: 15.00  CLINICAL INFORMATION Sleep Study Type: NPSG  Indication for sleep study: Snoring, sleep disruption and daytime sleepiness.  Epworth Sleepiness Score: 16  SLEEP STUDY TECHNIQUE As per the AASM Manual for the Scoring of Sleep and Associated Events v2.3 (April 2016) with a hypopnea requiring 4% desaturations.  The channels recorded and monitored were frontal, central and occipital EEG, electrooculogram (EOG), submentalis EMG (chin), nasal and oral airflow, thoracic and abdominal wall motion, anterior tibialis EMG, snore microphone, electrocardiogram, and pulse oximetry.  MEDICATIONS Medications self-administered by patient taken the night of the study : N/A  SLEEP ARCHITECTURE The study was initiated at 10:21:33 PM and ended at 5:20:04 AM.  Sleep onset time was 1.2 minutes and the sleep efficiency was 97.0%. The total sleep time was 405.8 minutes.  Stage REM latency was 82.0 minutes.  The patient spent 1.85% of the night in stage N1 sleep, 48.99% in stage N2 sleep, 27.11% in stage N3 and 22.1% in REM.  Alpha intrusion was absent.  Supine sleep was 44.48%.  RESPIRATORY PARAMETERS The overall apnea/hypopnea index (AHI) was 14.9 per hour. There were 1 total apneas, including 1 obstructive, 0 central and 0 mixed apneas. There were 100 hypopneas and 0 RERAs.  The AHI during Stage REM sleep was 43.6 per hour.  AHI while supine was 30.6 per hour.  The mean oxygen saturation was 93.14%. The minimum SpO2 during sleep was 84.00%.  Moderate snoring was noted during this study.  CARDIAC DATA The 2 lead EKG demonstrated sinus rhythm. The mean heart rate was 69.84 beats per minute. Other  EKG findings include: None.  LEG MOVEMENT DATA The total PLMS were 0 with a resulting PLMS index of 0.00. Associated arousal with leg movement index was 0.0 .  IMPRESSIONS - Mild to moderate obstructive sleep apnea with an AHI of 14.9 and SpO2 low of 84%. - Significant REM and supine effect. - The patient snored with moderate snoring volume. - No cardiac abnormalities were noted during this study. - Clinically significant periodic limb movements did not occur during sleep. No significant associated arousals.  DIAGNOSIS - Obstructive Sleep Apnea (G47.33)  RECOMMENDATIONS - Positional therapy avoiding supine position during sleep. - Additional therapies include CPAP, oral appliance, or surgical assessment. - Avoid alcohol, sedatives and other CNS depressants that may worsen sleep apnea and disrupt normal sleep architecture. - Sleep hygiene should be reviewed to assess factors that may improve sleep quality. - Weight management and regular exercise should be initiated or continued if appropriate. - Consider consultation with sleep medicine specialist available as needed to assist with management.  [Electronically signed] 11/06/2022 03:01 PM  Chesley Mires MD, ABSM Diplomate, American Board of Sleep Medicine NPI: [id number]  [place] PH: [phone]   FX: [phone] [place]
